# Patient Record
Sex: Male | Born: 1950 | Race: White | Hispanic: No | Smoking: Never smoker
Health system: Southern US, Community
[De-identification: ages and names within clinical notes are randomized; demographics above are authoritative.]

## PROBLEM LIST (undated history)

## (undated) DIAGNOSIS — M1990 Unspecified osteoarthritis, unspecified site: Secondary | ICD-10-CM

## (undated) DIAGNOSIS — M1710 Unilateral primary osteoarthritis, unspecified knee: Secondary | ICD-10-CM

## (undated) DIAGNOSIS — G4730 Sleep apnea, unspecified: Secondary | ICD-10-CM

## (undated) DIAGNOSIS — K429 Umbilical hernia without obstruction or gangrene: Secondary | ICD-10-CM

## (undated) DIAGNOSIS — K219 Gastro-esophageal reflux disease without esophagitis: Secondary | ICD-10-CM

## (undated) DIAGNOSIS — R7303 Prediabetes: Secondary | ICD-10-CM

## (undated) DIAGNOSIS — Z9109 Other allergy status, other than to drugs and biological substances: Secondary | ICD-10-CM

## (undated) DIAGNOSIS — M24112 Other articular cartilage disorders, left shoulder: Secondary | ICD-10-CM

## (undated) DIAGNOSIS — E7800 Pure hypercholesterolemia, unspecified: Secondary | ICD-10-CM

## (undated) HISTORY — PX: HERNIA REPAIR: SHX51

## (undated) HISTORY — PX: INCISION AND DRAINAGE ABSCESS ANAL: SUR669

## (undated) HISTORY — PX: JOINT REPLACEMENT: SHX530

---

## 2005-08-13 HISTORY — PX: FOOT SURGERY: SHX648

## 2006-08-13 HISTORY — PX: INGUINAL HERNIA REPAIR: SUR1180

## 2006-09-23 ENCOUNTER — Ambulatory Visit: Payer: Self-pay | Admitting: Specialist

## 2008-02-19 ENCOUNTER — Ambulatory Visit: Payer: Self-pay | Admitting: Unknown Physician Specialty

## 2009-01-06 ENCOUNTER — Ambulatory Visit: Payer: Self-pay | Admitting: Surgery

## 2009-08-13 HISTORY — PX: OTHER SURGICAL HISTORY: SHX169

## 2010-07-03 ENCOUNTER — Ambulatory Visit: Payer: Self-pay | Admitting: Gastroenterology

## 2010-08-13 HISTORY — PX: HERNIA REPAIR: SHX51

## 2011-01-29 ENCOUNTER — Observation Stay: Payer: Self-pay | Admitting: Surgery

## 2013-08-13 HISTORY — PX: ROTATOR CUFF REPAIR: SHX139

## 2013-12-16 ENCOUNTER — Ambulatory Visit: Payer: Self-pay | Admitting: General Practice

## 2014-01-08 ENCOUNTER — Ambulatory Visit: Payer: Self-pay | Admitting: General Practice

## 2014-12-04 NOTE — Op Note (Signed)
PATIENT NAME:  Richard Davies MR#:  [id number] DATE OF BIRTH:  04/19/1951  DATE OF PROCEDURE:  01/08/2014  PREOPERATIVE DIAGNOSIS:  Right rotator cuff tear.   POSTOPERATIVE DIAGNOSIS:  Right rotator cuff tear (supraspinatus, chronic).   PROCEDURE PERFORMED:  Right subacromial decompression and rotator cuff repair.   SURGEON:  Francesco SorJames Hooten, M.D.   ANESTHESIA:  Interscalene block and general.   ESTIMATED BLOOD LOSS:  Minimal.   DRAINS:  None.   IMPLANTS UTILIZED:  ArthroCare Spartan 5.5 mm PEEK suture anchors x 2.   INDICATIONS FOR SURGERY:  The patient is Davies 64 year old right-hand dominant male who has Davies history of progressive right shoulder pain.  He has not seen any significant improvement despite activity modification and conservative measures.  MRI demonstrated findings consistent with full-thickness tear of the supraspinatus tendon with retraction.  After discussion of the risks and benefits of surgical intervention, the patient expressed understanding of the risks and benefits and agreed with plans for surgical intervention.   PROCEDURE IN DETAIL:  The patient was brought into the Operating Room and, after adequate interscalene block and general anesthesia was achieved, the patient was placed in the modified beach chair position.  The head was secured in Davies headrest and all bony prominences were well padded.  The patient's right shoulder and arm were cleaned and prepped with alcohol and DuraPrep draped in the usual sterile fashion.  Davies "timeout" was performed as per usual protocol.  The anticipated incision site was injected with 0.25% Marcaine with epinephrine.  An anterior oblique incision was made roughly bisecting the anterior aspect of the acromion.  The deltoid fibers were split in line and Davies "deltoid on" approach was utilized by elevating the deltoid in Davies subperiosteal fashion off of the chromium.  Thickened subdeltoid bursa was excised.  Full-thickness tear of the supraspinatus was  noted with retraction of the entire supraspinatus.  Davies Darrach retractor was inserted and an anterior/inferior wafer of bone was removed from the acromion using an osteotome.  Thickness of the fragment was approximately 3 to 4 mm.  Some findings of nonunited os acromiale was noted and additional drain was performed.  Subacromial space was further contoured using Davies TPS high-speed rasp.  The wound was irrigated with copious amounts of normal saline with antibiotic solution and then suctioned dry.  The articular surface that was visualized superiorly was in good condition.  Davies Joker elevator was used to mobilize the remnant of the supraspinatus tendon.  Small bony trough was created using rongeurs for attachment of the tendon to the area of the greater tuberosity.  Two ArthroCare Spartan 5.5 mm PEEK suture anchors were inserted and the anterior portion of the supraspinatus tendon was repaired using the associated #2 nonabsorbable sutures.  This allowed for coverage of most of the humeral head.  Posterior portion was still left uncovered, but tissue was not viable for any additional repair of this area.  The shoulder was placed through Davies range of motion with good maintenance of the repair.  The wound was again irrigated with copious amounts of normal saline with antibiotic solution and then suctioned dry.  Good hemostasis was noted.  The deltoid was repaired in Davies side-to-side fashion using interrupted sutures of #1 Ethibond.  The subcutaneous tissue was approximated in layers using first #0 Vicryl followed by #2-0 Vicryl.  Skin was closed with skin staples.  Davies sterile dressing was applied.   The patient tolerated the procedure well.  He was transported to the recovery  room in stable condition.     ____________________________ Illene Labrador. Angie Fava., MD jph:ea D: 01/09/2014 14:26:43 ET T: 01/10/2014 00:20:45 ET JOB#: [id number]  cc: Illene Labrador. Angie Fava., MD, <Dictator> JAMES P Angie Fava MD ELECTRONICALLY SIGNED  01/10/2014 8:52

## 2016-02-22 ENCOUNTER — Other Ambulatory Visit: Payer: Self-pay | Admitting: Physician Assistant

## 2016-02-22 DIAGNOSIS — M7542 Impingement syndrome of left shoulder: Secondary | ICD-10-CM

## 2016-03-06 ENCOUNTER — Ambulatory Visit
Admission: RE | Admit: 2016-03-06 | Discharge: 2016-03-06 | Disposition: A | Discharge status: Home / Self Care | Payer: Commercial Managed Care - HMO | Source: Ambulatory Visit | Attending: Physician Assistant | Admitting: Physician Assistant

## 2016-03-06 DIAGNOSIS — M7582 Other shoulder lesions, left shoulder: Secondary | ICD-10-CM | POA: Insufficient documentation

## 2016-03-06 DIAGNOSIS — M24812 Other specific joint derangements of left shoulder, not elsewhere classified: Secondary | ICD-10-CM | POA: Diagnosis not present

## 2016-03-06 DIAGNOSIS — M7542 Impingement syndrome of left shoulder: Secondary | ICD-10-CM | POA: Insufficient documentation

## 2016-03-28 ENCOUNTER — Encounter
Admission: RE | Admit: 2016-03-28 | Discharge: 2016-03-28 | Disposition: A | Discharge status: Home / Self Care | Payer: 59 | Source: Ambulatory Visit | Attending: Surgery | Admitting: Surgery

## 2016-03-28 HISTORY — DX: Unspecified osteoarthritis, unspecified site: M19.90

## 2016-03-28 HISTORY — DX: Sleep apnea, unspecified: G47.30

## 2016-03-28 NOTE — Patient Instructions (Signed)
  Your procedure is scheduled on: 04-05-16 Report to Same Day Surgery 2nd floor medical mall To find out your arrival time please call [phone] between 1PM - 3PM on 04-04-16  Remember: Instructions that are not followed completely may result in serious medical risk, up to and including death, or upon the discretion of your surgeon and anesthesiologist your surgery may need to be rescheduled.    _x___ 1. Do not eat food or drink liquids after midnight. No gum chewing or hard candies.     __x__ 2. No Alcohol for 24 hours before or after surgery.   __x__3. No Smoking for 24 prior to surgery.   ____  4. Bring all medications with you on the day of surgery if instructed.    __x__ 5. Notify your doctor if there is any change in your medical condition     (cold, fever, infections).     Do not wear jewelry, make-up, hairpins, clips or nail polish.  Do not wear lotions, powders, or perfumes. You may wear deodorant.  Do not shave 48 hours prior to surgery. Men may shave face and neck.  Do not bring valuables to the hospital.    [facility] is not responsible for any belongings or valuables.               Contacts, dentures or bridgework may not be worn into surgery.  Leave your suitcase in the car. After surgery it may be brought to your room.  For patients admitted to the hospital, discharge time is determined by your treatment team.   Patients discharged the day of surgery will not be allowed to drive home.    Please read over the following fact sheets that you were given:   [facility] and or MRSA Information   ____ Take these medicines the morning of surgery with A SIP OF WATER:    1. NONE  2.  3.  4.  5.  6.  ____ Fleet Enema (as directed)   _x___ Use CHG Soap or sage wipes as directed on instruction sheet   ____ Use inhalers on the day of surgery and bring to hospital day of surgery  ____ Stop metformin 2 days prior to surgery    ____ Take 1/2 of  usual insulin dose the night before surgery and none on the morning of  surgery.   ____ Stop aspirin or coumadin, or plavix  _x__ Stop Anti-inflammatories such as Advil, Aleve, Ibuprofen, Motrin, Naproxen,          Naprosyn, Goodies powders or aspirin products-STOP NAPROXEN NOW. Ok to take Tylenol.   ____ Stop supplements until after surgery.    ____ Bring C-Pap to the hospital.

## 2016-04-05 ENCOUNTER — Ambulatory Visit: Payer: Commercial Managed Care - HMO | Admitting: Anesthesiology

## 2016-04-05 ENCOUNTER — Encounter
Admission: RE | Disposition: A | Discharge status: Home / Self Care | Payer: Self-pay | Source: Ambulatory Visit | Attending: Surgery

## 2016-04-05 ENCOUNTER — Ambulatory Visit
Admission: RE | Admit: 2016-04-05 | Discharge: 2016-04-05 | Disposition: A | Discharge status: Home / Self Care | Payer: Commercial Managed Care - HMO | Source: Ambulatory Visit | Attending: Surgery | Admitting: Surgery

## 2016-04-05 ENCOUNTER — Encounter: Payer: Self-pay | Admitting: *Deleted

## 2016-04-05 DIAGNOSIS — M75102 Unspecified rotator cuff tear or rupture of left shoulder, not specified as traumatic: Secondary | ICD-10-CM | POA: Diagnosis present

## 2016-04-05 DIAGNOSIS — Z8042 Family history of malignant neoplasm of prostate: Secondary | ICD-10-CM | POA: Insufficient documentation

## 2016-04-05 DIAGNOSIS — Z808 Family history of malignant neoplasm of other organs or systems: Secondary | ICD-10-CM | POA: Insufficient documentation

## 2016-04-05 DIAGNOSIS — Z9889 Other specified postprocedural states: Secondary | ICD-10-CM | POA: Insufficient documentation

## 2016-04-05 DIAGNOSIS — Z881 Allergy status to other antibiotic agents status: Secondary | ICD-10-CM | POA: Diagnosis not present

## 2016-04-05 DIAGNOSIS — Z87891 Personal history of nicotine dependence: Secondary | ICD-10-CM | POA: Diagnosis not present

## 2016-04-05 DIAGNOSIS — Z885 Allergy status to narcotic agent status: Secondary | ICD-10-CM | POA: Diagnosis not present

## 2016-04-05 DIAGNOSIS — Z880 Allergy status to penicillin: Secondary | ICD-10-CM | POA: Insufficient documentation

## 2016-04-05 DIAGNOSIS — G4730 Sleep apnea, unspecified: Secondary | ICD-10-CM | POA: Diagnosis not present

## 2016-04-05 DIAGNOSIS — Z791 Long term (current) use of non-steroidal anti-inflammatories (NSAID): Secondary | ICD-10-CM | POA: Insufficient documentation

## 2016-04-05 DIAGNOSIS — M65812 Other synovitis and tenosynovitis, left shoulder: Secondary | ICD-10-CM | POA: Diagnosis not present

## 2016-04-05 DIAGNOSIS — M1990 Unspecified osteoarthritis, unspecified site: Secondary | ICD-10-CM | POA: Insufficient documentation

## 2016-04-05 DIAGNOSIS — Z79899 Other long term (current) drug therapy: Secondary | ICD-10-CM | POA: Insufficient documentation

## 2016-04-05 DIAGNOSIS — M7542 Impingement syndrome of left shoulder: Secondary | ICD-10-CM | POA: Insufficient documentation

## 2016-04-05 DIAGNOSIS — M75112 Incomplete rotator cuff tear or rupture of left shoulder, not specified as traumatic: Secondary | ICD-10-CM | POA: Diagnosis not present

## 2016-04-05 HISTORY — PX: SHOULDER ARTHROSCOPY WITH SUBACROMIAL DECOMPRESSION: SHX5684

## 2016-04-05 HISTORY — PX: SHOULDER ARTHROSCOPY WITH OPEN ROTATOR CUFF REPAIR: SHX6092

## 2016-04-05 SURGERY — ARTHROSCOPY, SHOULDER WITH REPAIR, ROTATOR CUFF, OPEN
Anesthesia: General | Site: Shoulder | Laterality: Left

## 2016-04-05 MED ORDER — POTASSIUM CHLORIDE IN NACL 20-0.9 MEQ/L-% IV SOLN
INTRAVENOUS | Status: DC
  Filled 2016-04-05: qty 1000

## 2016-04-05 MED ORDER — ONDANSETRON HCL 4 MG PO TABS
4.0000 mg | ORAL_TABLET | Freq: Four times a day (QID) | ORAL | Status: DC | PRN
Start: 2016-04-05 — End: 2016-04-05

## 2016-04-05 MED ORDER — CLINDAMYCIN PHOSPHATE 900 MG/50ML IV SOLN
INTRAVENOUS | Status: AC
  Filled 2016-04-05: qty 50

## 2016-04-05 MED ORDER — EPHEDRINE SULFATE 50 MG/ML IJ SOLN
INTRAMUSCULAR | Status: DC | PRN
  Administered 2016-04-05: 15 mg via INTRAVENOUS

## 2016-04-05 MED ORDER — METOCLOPRAMIDE HCL 10 MG PO TABS: 5.0000 mg | ORAL_TABLET | Freq: Three times a day (TID) | ORAL | Status: DC | PRN

## 2016-04-05 MED ORDER — GLYCOPYRROLATE 0.2 MG/ML IJ SOLN
INTRAMUSCULAR | Status: DC | PRN
  Administered 2016-04-05: 0.4 mg via INTRAVENOUS
  Administered 2016-04-05: 0.2 mg via INTRAVENOUS

## 2016-04-05 MED ORDER — MIDAZOLAM HCL 2 MG/2ML IJ SOLN
INTRAMUSCULAR | Status: DC | PRN
  Administered 2016-04-05: 1 mg via INTRAVENOUS

## 2016-04-05 MED ORDER — PROPOFOL 10 MG/ML IV BOLUS
INTRAVENOUS | Status: DC | PRN
  Administered 2016-04-05: 140 mg via INTRAVENOUS

## 2016-04-05 MED ORDER — MIDAZOLAM HCL 5 MG/5ML IJ SOLN
INTRAMUSCULAR | Status: AC
  Administered 2016-04-05: 1 mg via INTRAVENOUS
  Filled 2016-04-05: qty 5

## 2016-04-05 MED ORDER — FENTANYL CITRATE (PF) 100 MCG/2ML IJ SOLN: 25.0000 ug | INTRAMUSCULAR | Status: DC | PRN

## 2016-04-05 MED ORDER — PROMETHAZINE HCL 25 MG/ML IJ SOLN: 6.2500 mg | INTRAMUSCULAR | Status: DC | PRN

## 2016-04-05 MED ORDER — ACETAMINOPHEN 10 MG/ML IV SOLN
INTRAVENOUS | Status: DC | PRN
  Administered 2016-04-05: 1000 mg via INTRAVENOUS

## 2016-04-05 MED ORDER — FENTANYL CITRATE (PF) 100 MCG/2ML IJ SOLN
INTRAMUSCULAR | Status: DC | PRN
  Administered 2016-04-05: 200 ug via INTRAVENOUS
  Administered 2016-04-05: 100 ug via INTRAVENOUS

## 2016-04-05 MED ORDER — CLINDAMYCIN PHOSPHATE 900 MG/50ML IV SOLN
900.0000 mg | Freq: Once | INTRAVENOUS | Status: AC
  Administered 2016-04-05: 900 mg via INTRAVENOUS

## 2016-04-05 MED ORDER — PHENYLEPHRINE HCL 10 MG/ML IJ SOLN
INTRAMUSCULAR | Status: DC | PRN
  Administered 2016-04-05: 100 ug via INTRAVENOUS
  Administered 2016-04-05: 200 ug via INTRAVENOUS
  Administered 2016-04-05 (×2): 100 ug via INTRAVENOUS

## 2016-04-05 MED ORDER — ONDANSETRON HCL 4 MG/2ML IJ SOLN
INTRAMUSCULAR | Status: DC | PRN
  Administered 2016-04-05: 4 mg via INTRAVENOUS

## 2016-04-05 MED ORDER — NEOSTIGMINE METHYLSULFATE 10 MG/10ML IV SOLN
INTRAVENOUS | Status: DC | PRN
  Administered 2016-04-05: 3 mg via INTRAVENOUS

## 2016-04-05 MED ORDER — FAMOTIDINE 20 MG PO TABS
20.0000 mg | ORAL_TABLET | Freq: Once | ORAL | Status: AC
  Administered 2016-04-05: 20 mg via ORAL

## 2016-04-05 MED ORDER — EPINEPHRINE HCL 1 MG/ML IJ SOLN
INTRAMUSCULAR | Status: DC | PRN
  Administered 2016-04-05: 2 mL

## 2016-04-05 MED ORDER — ONDANSETRON HCL 4 MG/2ML IJ SOLN: 4.0000 mg | Freq: Four times a day (QID) | INTRAMUSCULAR | Status: DC | PRN

## 2016-04-05 MED ORDER — OXYCODONE HCL 5 MG PO TABS: 5.0000 mg | ORAL_TABLET | Freq: Once | ORAL | Status: DC | PRN

## 2016-04-05 MED ORDER — ROPIVACAINE HCL 5 MG/ML IJ SOLN
INTRAMUSCULAR | Status: AC
  Filled 2016-04-05: qty 40

## 2016-04-05 MED ORDER — ROCURONIUM BROMIDE 100 MG/10ML IV SOLN
INTRAVENOUS | Status: DC | PRN
  Administered 2016-04-05: 50 mg via INTRAVENOUS

## 2016-04-05 MED ORDER — BUPIVACAINE-EPINEPHRINE (PF) 0.5% -1:200000 IJ SOLN
INTRAMUSCULAR | Status: AC
  Filled 2016-04-05: qty 30

## 2016-04-05 MED ORDER — LACTATED RINGERS IV SOLN
INTRAVENOUS | Status: DC
  Administered 2016-04-05 (×2): via INTRAVENOUS

## 2016-04-05 MED ORDER — EPINEPHRINE HCL 1 MG/ML IJ SOLN
INTRAMUSCULAR | Status: AC
  Filled 2016-04-05: qty 1

## 2016-04-05 MED ORDER — ROPIVACAINE HCL 5 MG/ML IJ SOLN
INTRAMUSCULAR | Status: DC | PRN
  Administered 2016-04-05: 30 mL via PERINEURAL

## 2016-04-05 MED ORDER — MIDAZOLAM HCL 5 MG/5ML IJ SOLN
1.0000 mg | Freq: Once | INTRAMUSCULAR | Status: AC
  Administered 2016-04-05: 1 mg via INTRAVENOUS

## 2016-04-05 MED ORDER — BUPIVACAINE-EPINEPHRINE 0.5% -1:200000 IJ SOLN
INTRAMUSCULAR | Status: DC | PRN
  Administered 2016-04-05: 30 mL

## 2016-04-05 MED ORDER — ACETAMINOPHEN 10 MG/ML IV SOLN
INTRAVENOUS | Status: AC
  Filled 2016-04-05: qty 100

## 2016-04-05 MED ORDER — FAMOTIDINE 20 MG PO TABS
ORAL_TABLET | ORAL | Status: AC
  Filled 2016-04-05: qty 1

## 2016-04-05 MED ORDER — LIDOCAINE HCL (PF) 1 % IJ SOLN
INTRAMUSCULAR | Status: AC
  Filled 2016-04-05: qty 5

## 2016-04-05 MED ORDER — OXYCODONE HCL 5 MG PO TABS: 5.0000 mg | ORAL_TABLET | ORAL | Status: DC | PRN

## 2016-04-05 MED ORDER — PHENYLEPHRINE HCL 10 MG/ML IJ SOLN
INTRAVENOUS | Status: DC | PRN
  Administered 2016-04-05: 30 ug/min via INTRAVENOUS

## 2016-04-05 MED ORDER — LIDOCAINE HCL (CARDIAC) 20 MG/ML IV SOLN
INTRAVENOUS | Status: DC | PRN
  Administered 2016-04-05: 60 mg via INTRAVENOUS

## 2016-04-05 MED ORDER — OXYCODONE HCL 5 MG PO TABS: 5.0000 mg | ORAL_TABLET | ORAL | 0 refills | Status: DC | PRN

## 2016-04-05 MED ORDER — METOCLOPRAMIDE HCL 5 MG/ML IJ SOLN: 5.0000 mg | Freq: Three times a day (TID) | INTRAMUSCULAR | Status: DC | PRN

## 2016-04-05 MED ORDER — MEPERIDINE HCL 25 MG/ML IJ SOLN: 6.2500 mg | INTRAMUSCULAR | Status: DC | PRN

## 2016-04-05 MED ORDER — LIDOCAINE HCL (PF) 4 % IJ SOLN
INTRAMUSCULAR | Status: DC | PRN
Start: 2016-04-05 — End: 2016-04-05
  Administered 2016-04-05: 4 mL via RESPIRATORY_TRACT

## 2016-04-05 MED ORDER — OXYCODONE HCL 5 MG/5ML PO SOLN: 5.0000 mg | Freq: Once | ORAL | Status: DC | PRN

## 2016-04-05 SURGICAL SUPPLY — 44 items
BIT DRILL JUGRKNT W/NDL BIT2.9 (DRILL) IMPLANT
BLADE FULL RADIUS 3.5 (BLADE) ×4 IMPLANT
BLADE SHAVER 4.5X7 STR FR (MISCELLANEOUS) IMPLANT
BUR ACROMIONIZER 4.0 (BURR) ×4 IMPLANT
BUR BR 5.5 WIDE MOUTH (BURR) IMPLANT
CANNULA SHAVER 8MMX76MM (CANNULA) ×4 IMPLANT
CHLORAPREP W/TINT 26ML (MISCELLANEOUS) ×8 IMPLANT
COVER MAYO STAND STRL (DRAPES) ×4 IMPLANT
DRAPE IMP U-DRAPE 54X76 (DRAPES) ×8 IMPLANT
DRILL JUGGERKNOT W/NDL BIT 2.9 (DRILL)
DRSG OPSITE POSTOP 4X8 (GAUZE/BANDAGES/DRESSINGS) ×4 IMPLANT
ELECT REM PT RETURN 9FT ADLT (ELECTROSURGICAL) ×4
ELECTRODE REM PT RTRN 9FT ADLT (ELECTROSURGICAL) ×2 IMPLANT
GAUZE PETRO XEROFOAM 1X8 (MISCELLANEOUS) ×4 IMPLANT
GAUZE SPONGE 4X4 12PLY STRL (GAUZE/BANDAGES/DRESSINGS) ×4 IMPLANT
GLOVE BIO SURGEON STRL SZ7.5 (GLOVE) ×8 IMPLANT
GLOVE BIO SURGEON STRL SZ8 (GLOVE) ×8 IMPLANT
GLOVE BIOGEL PI IND STRL 8 (GLOVE) ×2 IMPLANT
GLOVE BIOGEL PI INDICATOR 8 (GLOVE) ×2
GLOVE INDICATOR 8.0 STRL GRN (GLOVE) ×4 IMPLANT
GOWN STRL REUS W/ TWL LRG LVL3 (GOWN DISPOSABLE) ×4 IMPLANT
GOWN STRL REUS W/ TWL XL LVL3 (GOWN DISPOSABLE) ×2 IMPLANT
GOWN STRL REUS W/TWL LRG LVL3 (GOWN DISPOSABLE) ×4
GOWN STRL REUS W/TWL XL LVL3 (GOWN DISPOSABLE) ×2
GRASPER SUT 15 45D LOW PRO (SUTURE) IMPLANT
IV LACTATED RINGER IRRG 3000ML (IV SOLUTION) ×4
IV LR IRRIG 3000ML ARTHROMATIC (IV SOLUTION) ×4 IMPLANT
MANIFOLD NEPTUNE II (INSTRUMENTS) ×4 IMPLANT
MASK FACE SPIDER DISP (MASK) ×4 IMPLANT
MAT BLUE FLOOR 46X72 FLO (MISCELLANEOUS) ×4 IMPLANT
NEEDLE REVERSE CUT 1/2 CRC (NEEDLE) IMPLANT
PACK ARTHROSCOPY SHOULDER (MISCELLANEOUS) ×4 IMPLANT
SLING ARM LRG DEEP (SOFTGOODS) IMPLANT
SLING ULTRA II LG (MISCELLANEOUS) ×4 IMPLANT
STAPLER SKIN PROX 35W (STAPLE) ×4 IMPLANT
STRAP SAFETY BODY (MISCELLANEOUS) ×4 IMPLANT
SUT ETHIBOND 0 MO6 C/R (SUTURE) ×4 IMPLANT
SUT VIC AB 2-0 CT1 27 (SUTURE) ×4
SUT VIC AB 2-0 CT1 TAPERPNT 27 (SUTURE) ×4 IMPLANT
TAPE MICROFOAM 4IN (TAPE) ×4 IMPLANT
TUBING ARTHRO INFLOW-ONLY STRL (TUBING) ×4 IMPLANT
TUBING CONNECTING 10 (TUBING) ×3 IMPLANT
TUBING CONNECTING 10' (TUBING) ×1
WAND HAND CNTRL MULTIVAC 90 (MISCELLANEOUS) ×4 IMPLANT

## 2016-04-05 NOTE — Anesthesia Preprocedure Evaluation (Signed)
Anesthesia Evaluation  Patient identified by MRN, date of birth, ID band Patient awake    Reviewed: Allergy & Precautions, NPO status , Patient's Chart, lab work & pertinent test results  History of Anesthesia Complications Negative for: history of anesthetic complications  Airway Mallampati: II  TM Distance: >3 FB Neck ROM: Full    Dental no notable dental hx.    Pulmonary sleep apnea (does not use CPAP) , neg COPD,    breath sounds clear to auscultation- rhonchi (-) wheezing      Cardiovascular Exercise Tolerance: Good (-) hypertension(-) CAD and (-) Past MI  Rhythm:Regular Rate:Normal - Systolic murmurs and - Diastolic murmurs    Neuro/Psych negative neurological ROS  negative psych ROS   GI/Hepatic negative GI ROS, Neg liver ROS,   Endo/Other  negative endocrine ROSneg diabetes  Renal/GU negative Renal ROS     Musculoskeletal  (+) Arthritis , Osteoarthritis,    Abdominal (+) - obese,   Peds  Hematology negative hematology ROS (+)   Anesthesia Other Findings   Reproductive/Obstetrics                             Anesthesia Physical Anesthesia Plan  ASA: I  Anesthesia Plan: General   Post-op Pain Management:  Regional for Post-op pain   Induction: Intravenous  Airway Management Planned: Oral ETT  Additional Equipment:   Intra-op Plan:   Post-operative Plan: Extubation in OR  Informed Consent: I have reviewed the patients History and Physical, chart, labs and discussed the procedure including the risks, benefits and alternatives for the proposed anesthesia with the patient or authorized representative who has indicated his/her understanding and acceptance.   Dental advisory given  Plan Discussed with: CRNA and Anesthesiologist  Anesthesia Plan Comments:         Anesthesia Quick Evaluation

## 2016-04-05 NOTE — Anesthesia Postprocedure Evaluation (Signed)
Anesthesia Post Note  Patient: Glorious Peachhomas A Langworthy  Procedure(s) Performed: Procedure(s) (LRB): SHOULDER ARTHROSCOPY WITH OPEN ROTATOR CUFF REPAIR (Left) SHOULDER ARTHROSCOPY WITH SUBACROMIAL DECOMPRESSION AND LIMITED DEBRIDMENT (Left)  Patient location during evaluation: PACU Anesthesia Type: General Level of consciousness: awake and alert and oriented Pain management: pain level controlled Vital Signs Assessment: post-procedure vital signs reviewed and stable Respiratory status: spontaneous breathing, nonlabored ventilation and respiratory function stable Cardiovascular status: blood pressure returned to baseline and stable Postop Assessment: no signs of nausea or vomiting Anesthetic complications: no    Last Vitals:  Vitals:   04/05/16 1000 04/05/16 1207  BP: 134/87   Pulse: 61   Resp: 17   Temp:  (!) 36 C    Last Pain:  Vitals:   04/05/16 1207  TempSrc:   PainSc: 0-No pain                 Sholom Dulude

## 2016-04-05 NOTE — Transfer of Care (Signed)
Immediate Anesthesia Transfer of Care Note  Patient: Richard Davies  Procedure(s) Performed: Procedure(s): SHOULDER ARTHROSCOPY WITH OPEN ROTATOR CUFF REPAIR (Left) SHOULDER ARTHROSCOPY WITH SUBACROMIAL DECOMPRESSION AND LIMITED DEBRIDMENT (Left)  Patient Location: PACU  Anesthesia Type:General  Level of Consciousness: awake, alert , oriented and patient cooperative  Airway & Oxygen Therapy: Patient Spontanous Breathing and Patient connected to nasal cannula oxygen  Post-op Assessment: Report given to RN and Post -op Vital signs reviewed and stable  Post vital signs: Reviewed and stable  Last Vitals:  Vitals:   04/05/16 0950 04/05/16 1000  BP: 131/89 134/87  Pulse: (!) 57 61  Resp: 17 17  Temp:      Last Pain:  Vitals:   04/05/16 0805  TempSrc: Tympanic  PainSc: 5          Complications: No apparent anesthesia complications

## 2016-04-05 NOTE — Op Note (Signed)
04/05/2016  11:50 AM  Patient:   Richard Davies  Pre-Op Diagnosis:   Rotator cuff tear, left shoulder.  Postoperative diagnosis: Near full-thickness bursal surface rotator cuff tear and degenerative labral fraying, left shoulder.  Procedure: Limited arthroscopic debridement, arthroscopic subacromial decompression, and mini-open rotator cuff repair, left shoulder.  Anesthesia: General endotracheal with interscalene block placed preoperatively by the anesthesiologist.  Surgeon:   Maryagnes AmosJ. Jeffrey Xavier Fournier, MD  Assistant:   Horris LatinoLance McGhee, PA-C  Findings: As above. There was moderate fraying of the labrum anteriorly and superiorly without frank detachment from the glenoid. The biceps tendon itself was in excellent condition, as were the articular surfaces of the glenoid and humerus.  Complications: None  Fluids:   1100 cc  Estimated blood loss: 10 cc  Tourniquet time: None  Drains: None  Closure: Staples   Brief clinical note: The patient is a 65 year old male with a history of left shoulder pain. The patient's symptoms have progressed despite medications, activity modification, etc. The patient's history and examination are consistent with impingement/tendinopathy with a rotator cuff tear. These findings were confirmed by MRI scan. The patient presents at this time for definitive management of these shoulder symptoms.  Procedure: The patient underwent placement of an interscalene block by the anesthesiologist in the preoperative holding area before he was brought into the operating room and lain in the supine position. The patient then underwent general endotracheal intubation and anesthesia before being repositioned in the beach chair position using the beach chair positioner. The left shoulder and upper extremity were prepped with ChloraPrep solution before being draped sterilely. Preoperative antibiotics were administered. A timeout was performed to confirm the  proper surgical site before the expected portal sites and incision site were injected with 0.5% Sensorcaine with epinephrine. A posterior portal was created and the glenohumeral joint thoroughly inspected with the findings as described above. An anterior portal was created using an outside-in technique. The labrum and rotator cuff were further probed, again confirming the above-noted findings. Areas of labral fraying and synovitis anteriorly and superiorly were debrided using the full-radius resector. The ArthroCare wand was then used to obtain hemostasis as well as to "anneal" the labrum superiorly and anteriorly. The instruments were removed from the joint after suctioning the excess fluid.  The camera was repositioned through the posterior portal into the subacromial space. A separate lateral portal was created using an outside-in technique. The 3.5 mm full-radius resector was introduced and used to perform a subtotal bursectomy. The ArthroCare wand was then inserted and used to remove the periosteal tissue off the undersurface of the anterior third of the acromion as well as to recess the coracoacromial ligament from its attachment along the anterior and lateral margins of the acromion. The 4.0 mm acromionizing bur was introduced and used to complete the decompression by removing the undersurface of the anterior third of the acromion. The full radius resector was reintroduced to remove any residual bony debris before the ArthroCare wand was reintroduced to obtain hemostasis. The instruments were then removed from the subacromial space after suctioning the excess fluid.  An approximately 4-5 cm incision was made over the anterolateral aspect of the shoulder beginning at the anterolateral corner of the acromion and extending distally in line with the bicipital groove. This incision was carried down through the subcutaneous tissues to expose the deltoid fascia. The raphae between the anterior and middle thirds  was identified and this plane developed to provide access into the subacromial space. Additional bursal tissues were debrided  sharply using Metzenbaum scissors. The rotator cuff tear was readily identified. The margins were debrided sharply with a #15 blade. The configuration of the tear was noted to be primarily a longitudinal tear so it was elected to perform a side-to-side repair. Multiple #0 Ethibond interrupted sutures were placed in a side-to-side fashion to effect the repair. An apparent watertight closure was obtained.  The wound was copiously irrigated with sterile saline solution before the deltoid raphae was reapproximated using 2-0 Vicryl interrupted sutures. The subcutaneous tissues were closed in two layers using 2-0 Vicryl interrupted sutures before the skin was closed using staples. The portal sites also were closed using staples. A sterile bulky dressing was applied to the shoulder before the arm was placed into a shoulder immobilizer. The patient was then awakened, extubated, and returned to the recovery room in satisfactory condition after tolerating the procedure well.

## 2016-04-05 NOTE — Discharge Instructions (Addendum)
AMBULATORY SURGERY  DISCHARGE INSTRUCTIONS   1) The drugs that you were given will stay in your system until tomorrow so for the next 24 hours you should not:  A) Drive an automobile B) Make any legal decisions C) Drink any alcoholic beverage   2) You may resume regular meals tomorrow.  Today it is better to start with liquids and gradually work up to solid foods.  You may eat anything you prefer, but it is better to start with liquids, then soup and crackers, and gradually work up to solid foods.   3) Please notify your doctor immediately if you have any unusual bleeding, trouble breathing, redness and pain at the surgery site, drainage, fever, or pain not relieved by medication. 4)   5) Your post-operative visit with Dr.                                     is: Date:                        Time:    Please call to schedule your post-operative visit.     6) Additional Instructions: Keep dressing dry and intact.  May shower after dressing changed on post-op day #4 (Monday).  Cover staples/sutures with Band-Aids after drying off. Apply ice frequently to shoulder. Take Aleve 2 tabs twice daily with meals for 7-10 days, then as necessary. Take oxycodone as prescribed when needed.  May supplement with ES Tylenol if necessary. Keep shoulder immobilizer on at all times except may remove for bathing purposes. Follow-up in 10-14 days or as scheduled.

## 2016-04-05 NOTE — H&P (Signed)
Paper H&P to be scanned into permanent record. H&P reviewed. No changes. 

## 2016-04-05 NOTE — Anesthesia Procedure Notes (Addendum)
Anesthesia Regional Block:  Interscalene brachial plexus block  Pre-Anesthetic Checklist: ,, timeout performed, Correct Patient, Correct Site, Correct Laterality, Correct Procedure, Correct Position, site marked, Risks and benefits discussed,  Surgical consent,  Pre-op evaluation,  At surgeon's request and post-op pain management  Laterality: Left  Prep: alcohol swabs       Needles:  Injection technique: Single-shot  Needle Type: Stimiplex     Needle Length: 5cm 5 cm Needle Gauge: 21 and 21 G    Additional Needles:  Procedures: ultrasound guided (picture in chart) Interscalene brachial plexus block Narrative:  Start time: 04/05/2016 8:50 AM End time: 04/05/2016 9:02 AM Injection made incrementally with aspirations every 5 mL.  Performed by: Personally  Anesthesiologist: Nehemias Sauceda  Additional Notes: Functioning IV was confirmed and monitors were applied.  A 50mm 22ga Stimuplex needle was used. Sterile prep and drape,hand hygiene and sterile gloves were used.  Negative aspiration and negative test dose prior to incremental administration of local anesthetic. The patient tolerated the procedure well.

## 2016-04-05 NOTE — Anesthesia Procedure Notes (Signed)
Procedure Name: Intubation Date/Time: 04/05/2016 10:18 AM Performed by: Rosaria Ferries, Emmelyn Schmale Pre-anesthesia Checklist: Patient identified Patient Re-evaluated:Patient Re-evaluated prior to inductionOxygen Delivery Method: Circle system utilized Preoxygenation: Pre-oxygenation with 100% oxygen Intubation Type: IV induction Laryngoscope Size: Mac and 3 Grade View: Grade I Tube size: 7.0 mm Number of attempts: 1 Airway Equipment and Method: LTA kit utilized Placement Confirmation: ETT inserted through vocal cords under direct vision,  positive ETCO2 and breath sounds checked- equal and bilateral Secured at: 23 cm Tube secured with: Tape Dental Injury: Teeth and Oropharynx as per pre-operative assessment

## 2017-03-20 ENCOUNTER — Encounter
Admission: RE | Admit: 2017-03-20 | Discharge: 2017-03-20 | Disposition: A | Discharge status: Home / Self Care | Payer: 59 | Source: Ambulatory Visit | Attending: Orthopedic Surgery | Admitting: Orthopedic Surgery

## 2017-03-20 DIAGNOSIS — Z01812 Encounter for preprocedural laboratory examination: Secondary | ICD-10-CM | POA: Insufficient documentation

## 2017-03-20 LAB — CBC
HCT: 43.3 % (ref 40.0–52.0)
HEMOGLOBIN: 14.8 g/dL (ref 13.0–18.0)
MCH: 31.4 pg (ref 26.0–34.0)
MCHC: 34.1 g/dL (ref 32.0–36.0)
MCV: 92.1 fL (ref 80.0–100.0)
PLATELETS: 302 10*3/uL (ref 150–440)
RBC: 4.7 MIL/uL (ref 4.40–5.90)
RDW: 13.3 % (ref 11.5–14.5)
WBC: 9 10*3/uL (ref 3.8–10.6)

## 2017-03-20 LAB — URINALYSIS, ROUTINE W REFLEX MICROSCOPIC
Bilirubin Urine: NEGATIVE
GLUCOSE, UA: NEGATIVE mg/dL
HGB URINE DIPSTICK: NEGATIVE
KETONES UR: NEGATIVE mg/dL
LEUKOCYTES UA: NEGATIVE
Nitrite: NEGATIVE
PH: 5 (ref 5.0–8.0)
Protein, ur: NEGATIVE mg/dL
Specific Gravity, Urine: 1.017 (ref 1.005–1.030)

## 2017-03-20 LAB — COMPREHENSIVE METABOLIC PANEL
ALK PHOS: 47 U/L (ref 38–126)
ALT: 23 U/L (ref 17–63)
AST: 20 U/L (ref 15–41)
Albumin: 4 g/dL (ref 3.5–5.0)
Anion gap: 8 (ref 5–15)
BUN: 20 mg/dL (ref 6–20)
CALCIUM: 9.3 mg/dL (ref 8.9–10.3)
CO2: 24 mmol/L (ref 22–32)
CREATININE: 1.16 mg/dL (ref 0.61–1.24)
Chloride: 108 mmol/L (ref 101–111)
Glucose, Bld: 90 mg/dL (ref 65–99)
Potassium: 4 mmol/L (ref 3.5–5.1)
Sodium: 140 mmol/L (ref 135–145)
TOTAL PROTEIN: 6.6 g/dL (ref 6.5–8.1)
Total Bilirubin: 1.6 mg/dL — ABNORMAL HIGH (ref 0.3–1.2)

## 2017-03-20 LAB — SEDIMENTATION RATE: SED RATE: 3 mm/h (ref 0–20)

## 2017-03-20 LAB — TYPE AND SCREEN
ABO/RH(D): A POS
Antibody Screen: NEGATIVE

## 2017-03-20 LAB — C-REACTIVE PROTEIN

## 2017-03-20 LAB — SURGICAL PCR SCREEN
MRSA, PCR: NEGATIVE
Staphylococcus aureus: POSITIVE — AB

## 2017-03-20 LAB — APTT: aPTT: 32 seconds (ref 24–36)

## 2017-03-20 LAB — PROTIME-INR
INR: 0.88
PROTHROMBIN TIME: 11.9 s (ref 11.4–15.2)

## 2017-03-20 NOTE — Patient Instructions (Signed)
  Your procedure is scheduled WU:[id number]:Monday August 20 , 2018. Report to Same Day Surgery. To find out your arrival time please call [phone] between 1PM - 3PM on Friday March 29, 2017.  Remember: Instructions that are not followed completely may result in serious medical risk, up to and including death, or upon the discretion of your surgeon and anesthesiologist your surgery may need to be rescheduled.    _x___ 1. Do not eat food or drink liquids after midnight. No gum chewing or hard candies.     _x___ 2. No Alcohol for 24 hours before or after surgery.   ____ 3. Bring all medications with you on the day of surgery if instructed.    __x__ 4. Notify your doctor if there is any change in your medical condition     (cold, fever, infections).    _____ 5. No smoking 24 hours prior to surgery.     Do not wear jewelry, make-up, hairpins, clips or nail polish.  Do not wear lotions, powders, or perfumes.   Do not shave 48 hours prior to surgery. Men may shave face and neck.  Do not bring valuables to the hospital.    [facility] not [facility] for any belongings or valuables.               Contacts, dentures or bridgework may not be worn into surgery.  Leave your suitcase in the car. After surgery it may be brought to your room.  For patients admitted to the hospital, discharge time is determined by your treatment team.   Patients discharged the day of surgery will not be allowed to drive home.    Please read over the following fact sheets that you were given:   [facility]  ____ Take these medicines the morning of surgery with A SIP OF WATER: None     ____ Fleet Enema (as directed)   __x__ Use CHG Soap as directed on instruction sheet  ____ Use inhalers on the day of surgery and bring to hospital day of surgery  ____ Stop metformin 2 days prior to surgery    ____ Take 1/2 of usual insulin dose the night before surgery and none on the morning of  surgery.   ____ Stop Coumadin/Plavix/aspirin on does not apply.  ___x_ Stop Anti-inflammatories such as Advil, Aleve, Ibuprofen, Motrin, Naproxen, Naprosyn, Goodies powders or aspirin  products. OK to take Tylenol.   ____ Stop supplements until after surgery.    ____ Bring C-Pap to the hospital.

## 2017-03-20 NOTE — Pre-Procedure Instructions (Signed)
Progress Notes - in this encounter  Table of Contents for Progress Notes Hooten, Adelina MingsJames Philmon Jr., MD - 03/12/2017 8:45 AM EDT Nance Pewussell, Elaine W, RN - 03/12/2017 8:45 AM EDT   Ernest PineHooten, Adelina MingsJames Philmon Jr., MD - 03/12/2017 8:45 AM EDT Formatting of this note may be different from the original. Chief Complaint: Chief Complaint  Patient presents with  . Knee Pain  Bilateral knee degenerative arthrosis   Reason for Visit: The patient is a 66 y.o. male who presents today with his wife for reevaluation of his knees. He reports a greater than 10 year(s) history of bilateral knee pain. The right knee is somewhat more symptomatic than the left. He localizes most of the pain along the medial aspect of the knees. He reports some swelling, no locking, and some giving way of the knee. The pain is aggravated by going up and down stairs, kneeling, rising after sitting, squatting, standing and walking. The patient has not appreciated any significant improvement despite NSAIDs, intraarticular corticosteroid injections, and viscosupplementation. He is not using any ambulatory aids. The pain increases after he has been at work for 3-4 hours.  The patient states that the knee pain has progressed to the point that it is significantly interfering with his activities of daily living.  Medications: Current Outpatient Prescriptions  Medication Sig Dispense Refill  . naproxen sodium (ALEVE, ANAPROX) 220 MG tablet Take 220 mg by mouth as needed.  . peg-electrolyte (NULYTELY) solution Take 4,000 mLs by mouth as directed. 4000 mL 0  . sildenafil (REVATIO) 20 mg tablet 3-5 tablets po daily as needed   No current facility-administered medications for this visit.   Allergies: Allergies  Allergen Reactions  . Ciprofloxacin Other (See Comments)  Joint pain  . Codeine Nausea  . Penicillins Rash  childhood   Past Medical History: Past Medical History:  Diagnosis Date  . Environmental allergies  . History of  hernia repair   Past Surgical History: Past Surgical History:  Procedure Laterality Date  . COLONOSCOPY 07/03/2010  Dr. Eber HongM. Skulskie @ [facility] - Adenomatous Polyp, rpt 5 yrs per [facility], ltr mailed  . Foot surgery 2009  . HERNIA REPAIR [id number];2013  . INCISION & DRAINAGE ABSCESS RECTAL 2011  . Limited arthroscopic debridement, arthroscopic subacromial decompression and mini-open rotator cuff repair left shoulder Left 04/05/2016  Dr.Poggi  . REPAIR ROTATOR CUFF TEAR CHRONIC OPEN Right 01/08/14   Social History: Social History   Social History  . Marital status: Married  Spouse name: Herbert SetaHeather  . Number of children: 1  . Years of education: 3415   Occupational History  . [employer] [phone]  self   Social History Main Topics  . Smoking status: Never Smoker  . Smokeless tobacco: Former NeurosurgeonUser  Types: Chew  Quit date: 12/31/1993  . Alcohol use 4.2 oz/week  7 Cans of beer per week  . Drug use: No  . Sexual activity: Yes  Partners: Female   Other Topics Concern  . Not on file   Social History Narrative  . No narrative on file   Family History: Family History  Problem Relation Age of Onset  . Prostate cancer Father  . Brain cancer Brother  . Prostate cancer Brother   Review of Systems: A comprehensive 14 point ROS was performed, reviewed, and the pertinent orthopaedic findings are documented in the HPI.  Exam Ht 175.3 cm (5\' 9" )  Wt 84.1 kg (185 lb 6.4 oz)  BMI 27.38 kg/m   General:  Well-developed, well-nourished male seen  in no acute distress.  Antalgic gait. Varus thrust to both knees.  HEENT:  Atraumatic, normocephalic. Pupils are equal and reactive to light. Extraocular motion is intact. Sclera are clear. Oropharynx is clear with moist mucosa.  Neck:  Supple, nontender, and with good ROM. No thyromegaly, adenopathy, JVD, or carotid bruits.  Lungs:  Clear to auscultation bilaterally.  Cardiovascular:  Regular rate and rhythm. Normal S1, S2. No murmur .  No appreciable gallops or rubs. Peripheral pulses are palpable. No lower extremity edema. Homan`s test is negative.  Abdomen:  Soft, nontender, nondistended. Bowel sounds are present.  Extremities: Good strength, stability, and range of motion of the upper extremities. Good range of motion of the hips and ankles.  Right Knee: Soft tissue swelling: mild Effusion: minimal Erythema: none Crepitance: mild Tenderness: medial joint line Alignment: relative varus Mediolateral laxity: medial pseudolaxity Posterior sag: negative Patellar tracking: Good tracking without evidence of subluxation or tilt Atrophy: No significant atrophy.  Quadriceps tone was good. Range of motion: 0/0/128 degrees  Left Knee: Soft tissue swelling: mild Effusion: minimal Erythema: none Crepitance: mild Tenderness: medial joint line Alignment: relative varus Mediolateral laxity: medial pseudolaxity Posterior sag: negative Patellar tracking: Good tracking without evidence of subluxation or tilt Atrophy: No significant atrophy.  Quadriceps tone was good. Range of motion: 0/0/131 degrees  Neurologic:  Awake, alert, and oriented.  Sensory function is intact to pinprick and light touch.  Motor strength is judged to be 5/5.  Motor coordination is within normal limits.  No apparent clonus. No tremor.   X-rays: I ordered and interpreted standing AP, lateral, and sunrise radiographs of the left knee that were obtained in the office today. There is narrowing of the medial cartilage space with associated varus alignment. No significant osteophyte formation is appreciated. No evidence of fracture or dislocation.   I ordered and interpreted standing AP, lateral, and sunrise radiographs of the right knee that were obtained in the office today. There is narrowing of the medial cartilage space with associated varus alignment. Early osteophyte formation is noted. Subchondral sclerosis is noted. No evidence of fracture  or dislocation.   Impression: Degenerative arthrosis of both knees, right more symptomatic than left   Plan:  The findings were discussed in detail with the patient. The patient was given informational material on total knee replacement. Conservative treatment options were reviewed with the patient. We discussed the risks and benefits of surgical intervention. The usual perioperative course was also discussed in detail. The patient expressed understanding of the risks and benefits of surgical intervention and would like to proceed with plans for right total knee arthroplasty.  MEDICAL CLEARANCE: Per anesthesiology. ACTIVITY: As tolerated. WORK STATUS: Anticipate out of work for 6-8 weeks following surgery. THERAPY: Preoperative physical therapy evaluation. MEDICATIONS: New Prescriptions  No medications on file   FOLLOW-UP: Return for preop History & Physical pending surgery date.  James P. Angie Fava., M.D.

## 2017-03-20 NOTE — Pre-Procedure Instructions (Signed)
Pt did not receive the total joint class information in the mail with his other paperwork/appointments.  Pt will be attending the joint class on 03/27/2017.

## 2017-03-21 NOTE — Pre-Procedure Instructions (Signed)
LABS WITH POSITIVE STAPH FAXED TO DR Elenor LegatoHOOTEN,S

## 2017-03-22 LAB — URINE CULTURE
Culture: NO GROWTH
Special Requests: NORMAL

## 2017-03-31 MED ORDER — TRANEXAMIC ACID 1000 MG/10ML IV SOLN
1000.0000 mg | INTRAVENOUS | Status: DC
  Filled 2017-03-31: qty 10

## 2017-03-31 MED ORDER — CLINDAMYCIN PHOSPHATE 900 MG/50ML IV SOLN: 900.0000 mg | INTRAVENOUS | Status: DC

## 2017-04-01 ENCOUNTER — Inpatient Hospital Stay
Admission: RE | Admit: 2017-04-01 | Discharge: 2017-04-03 | DRG: 470 | Disposition: A | Discharge status: Home / Self Care | Payer: Commercial Managed Care - HMO | Source: Ambulatory Visit | Attending: Orthopedic Surgery | Admitting: Orthopedic Surgery

## 2017-04-01 ENCOUNTER — Encounter: Payer: Self-pay | Admitting: Orthopedic Surgery

## 2017-04-01 ENCOUNTER — Inpatient Hospital Stay: Payer: Commercial Managed Care - HMO | Admitting: Anesthesiology

## 2017-04-01 ENCOUNTER — Inpatient Hospital Stay: Payer: Commercial Managed Care - HMO

## 2017-04-01 ENCOUNTER — Encounter
Admission: RE | Disposition: A | Discharge status: Home / Self Care | Payer: Self-pay | Source: Ambulatory Visit | Attending: Orthopedic Surgery

## 2017-04-01 DIAGNOSIS — G4730 Sleep apnea, unspecified: Secondary | ICD-10-CM | POA: Diagnosis present

## 2017-04-01 DIAGNOSIS — M25761 Osteophyte, right knee: Secondary | ICD-10-CM | POA: Diagnosis present

## 2017-04-01 DIAGNOSIS — Z96651 Presence of right artificial knee joint: Secondary | ICD-10-CM

## 2017-04-01 DIAGNOSIS — M1711 Unilateral primary osteoarthritis, right knee: Principal | ICD-10-CM | POA: Diagnosis present

## 2017-04-01 DIAGNOSIS — Z96659 Presence of unspecified artificial knee joint: Secondary | ICD-10-CM

## 2017-04-01 HISTORY — PX: KNEE ARTHROPLASTY: SHX992

## 2017-04-01 LAB — ABO/RH: ABO/RH(D): A POS

## 2017-04-01 SURGERY — ARTHROPLASTY, KNEE, TOTAL, USING IMAGELESS COMPUTER-ASSISTED NAVIGATION
Anesthesia: Spinal | Site: Knee | Laterality: Right | Wound class: Clean

## 2017-04-01 MED ORDER — BUPIVACAINE HCL (PF) 0.5 % IJ SOLN
INTRAMUSCULAR | Status: DC | PRN
  Administered 2017-04-01: 3 mL

## 2017-04-01 MED ORDER — SODIUM CHLORIDE 0.9 % IV SOLN
INTRAVENOUS | Status: DC
  Administered 2017-04-01 – 2017-04-02 (×2): via INTRAVENOUS

## 2017-04-01 MED ORDER — SODIUM CHLORIDE 0.9 % IV SOLN
INTRAVENOUS | Status: DC | PRN
  Administered 2017-04-01: 20 ug/min via INTRAVENOUS

## 2017-04-01 MED ORDER — ONDANSETRON HCL 4 MG/2ML IJ SOLN: 4.0000 mg | Freq: Four times a day (QID) | INTRAMUSCULAR | Status: DC | PRN

## 2017-04-01 MED ORDER — BUPIVACAINE LIPOSOME 1.3 % IJ SUSP
INTRAMUSCULAR | Status: DC | PRN
  Administered 2017-04-01: 60 mL

## 2017-04-01 MED ORDER — ACETAMINOPHEN 10 MG/ML IV SOLN
1000.0000 mg | Freq: Four times a day (QID) | INTRAVENOUS | Status: AC
  Administered 2017-04-01 – 2017-04-02 (×4): 1000 mg via INTRAVENOUS
  Filled 2017-04-01 (×4): qty 100

## 2017-04-01 MED ORDER — MIDAZOLAM HCL 2 MG/2ML IJ SOLN
INTRAMUSCULAR | Status: AC
  Filled 2017-04-01: qty 2

## 2017-04-01 MED ORDER — ONDANSETRON HCL 4 MG PO TABS: 4.0000 mg | ORAL_TABLET | Freq: Four times a day (QID) | ORAL | Status: DC | PRN

## 2017-04-01 MED ORDER — NEOMYCIN-POLYMYXIN B GU 40-200000 IR SOLN
Status: DC | PRN
  Administered 2017-04-01: 14 mL

## 2017-04-01 MED ORDER — TRAMADOL HCL 50 MG PO TABS
50.0000 mg | ORAL_TABLET | ORAL | Status: DC | PRN
  Administered 2017-04-01 – 2017-04-02 (×3): 100 mg via ORAL
  Filled 2017-04-01 (×3): qty 2

## 2017-04-01 MED ORDER — ONDANSETRON HCL 4 MG/2ML IJ SOLN: 4.0000 mg | Freq: Once | INTRAMUSCULAR | Status: DC | PRN

## 2017-04-01 MED ORDER — TRANEXAMIC ACID 1000 MG/10ML IV SOLN
1000.0000 mg | Freq: Once | INTRAVENOUS | Status: AC
  Administered 2017-04-01: 1000 mg via INTRAVENOUS
  Filled 2017-04-01: qty 10

## 2017-04-01 MED ORDER — CLINDAMYCIN PHOSPHATE 900 MG/50ML IV SOLN
INTRAVENOUS | Status: DC | PRN
  Administered 2017-04-01: 900 mg via INTRAVENOUS

## 2017-04-01 MED ORDER — PHENOL 1.4 % MT LIQD
1.0000 | OROMUCOSAL | Status: DC | PRN
  Filled 2017-04-01: qty 177

## 2017-04-01 MED ORDER — SENNOSIDES-DOCUSATE SODIUM 8.6-50 MG PO TABS
1.0000 | ORAL_TABLET | Freq: Two times a day (BID) | ORAL | Status: DC
  Administered 2017-04-01 – 2017-04-02 (×3): 1 via ORAL
  Filled 2017-04-01 (×4): qty 1

## 2017-04-01 MED ORDER — ACETAMINOPHEN 325 MG PO TABS: 650.0000 mg | ORAL_TABLET | Freq: Four times a day (QID) | ORAL | Status: DC | PRN

## 2017-04-01 MED ORDER — BISACODYL 10 MG RE SUPP: 10.0000 mg | Freq: Every day | RECTAL | Status: DC | PRN

## 2017-04-01 MED ORDER — PROPOFOL 500 MG/50ML IV EMUL
INTRAVENOUS | Status: DC | PRN
  Administered 2017-04-01: 60 ug/kg/min via INTRAVENOUS
  Administered 2017-04-01: 70 ug/kg/min via INTRAVENOUS

## 2017-04-01 MED ORDER — ENOXAPARIN SODIUM 30 MG/0.3ML ~~LOC~~ SOLN
30.0000 mg | Freq: Two times a day (BID) | SUBCUTANEOUS | Status: DC
  Administered 2017-04-02 – 2017-04-03 (×3): 30 mg via SUBCUTANEOUS
  Filled 2017-04-01 (×3): qty 0.3

## 2017-04-01 MED ORDER — SODIUM CHLORIDE 0.9 % IV SOLN
INTRAVENOUS | Status: DC | PRN
  Administered 2017-04-01: 12:00:00 via INTRAVENOUS
  Administered 2017-04-01: 1000 mg via INTRAVENOUS

## 2017-04-01 MED ORDER — FERROUS SULFATE 325 (65 FE) MG PO TABS
325.0000 mg | ORAL_TABLET | Freq: Two times a day (BID) | ORAL | Status: DC
  Administered 2017-04-01 – 2017-04-03 (×4): 325 mg via ORAL
  Filled 2017-04-01 (×4): qty 1

## 2017-04-01 MED ORDER — FENTANYL CITRATE (PF) 100 MCG/2ML IJ SOLN
INTRAMUSCULAR | Status: DC | PRN
  Administered 2017-04-01: 100 ug via INTRAVENOUS

## 2017-04-01 MED ORDER — MENTHOL 3 MG MT LOZG
1.0000 | LOZENGE | OROMUCOSAL | Status: DC | PRN
  Filled 2017-04-01: qty 9

## 2017-04-01 MED ORDER — ALUM & MAG HYDROXIDE-SIMETH 200-200-20 MG/5ML PO SUSP: 30.0000 mL | ORAL | Status: DC | PRN

## 2017-04-01 MED ORDER — PROPOFOL 500 MG/50ML IV EMUL
INTRAVENOUS | Status: AC
  Filled 2017-04-01: qty 50

## 2017-04-01 MED ORDER — LIDOCAINE HCL (PF) 2 % IJ SOLN
INTRAMUSCULAR | Status: AC
  Filled 2017-04-01: qty 2

## 2017-04-01 MED ORDER — PANTOPRAZOLE SODIUM 40 MG PO TBEC
40.0000 mg | DELAYED_RELEASE_TABLET | Freq: Two times a day (BID) | ORAL | Status: DC
  Administered 2017-04-01 – 2017-04-03 (×4): 40 mg via ORAL
  Filled 2017-04-01 (×4): qty 1

## 2017-04-01 MED ORDER — FLEET ENEMA 7-19 GM/118ML RE ENEM: 1.0000 | ENEMA | Freq: Once | RECTAL | Status: DC | PRN

## 2017-04-01 MED ORDER — BUPIVACAINE HCL (PF) 0.25 % IJ SOLN
INTRAMUSCULAR | Status: DC | PRN
  Administered 2017-04-01: 60 mL

## 2017-04-01 MED ORDER — LACTATED RINGERS IV SOLN
INTRAVENOUS | Status: DC
  Administered 2017-04-01 (×2): via INTRAVENOUS

## 2017-04-01 MED ORDER — CHLORHEXIDINE GLUCONATE 4 % EX LIQD: 60.0000 mL | Freq: Once | CUTANEOUS | Status: DC

## 2017-04-01 MED ORDER — GLYCOPYRROLATE 0.2 MG/ML IJ SOLN
INTRAMUSCULAR | Status: DC | PRN
  Administered 2017-04-01: 0.2 mg via INTRAVENOUS

## 2017-04-01 MED ORDER — DIPHENHYDRAMINE HCL 12.5 MG/5ML PO ELIX: 12.5000 mg | ORAL_SOLUTION | ORAL | Status: DC | PRN

## 2017-04-01 MED ORDER — MORPHINE SULFATE (PF) 2 MG/ML IV SOLN
2.0000 mg | INTRAVENOUS | Status: DC | PRN
Start: 2017-04-01 — End: 2017-04-03

## 2017-04-01 MED ORDER — ACETAMINOPHEN 10 MG/ML IV SOLN
INTRAVENOUS | Status: DC | PRN
  Administered 2017-04-01: 1000 mg via INTRAVENOUS

## 2017-04-01 MED ORDER — PROPOFOL 10 MG/ML IV BOLUS
INTRAVENOUS | Status: DC | PRN
  Administered 2017-04-01 (×2): 17 mg via INTRAVENOUS

## 2017-04-01 MED ORDER — OXYCODONE HCL 5 MG PO TABS
5.0000 mg | ORAL_TABLET | ORAL | Status: DC | PRN
  Administered 2017-04-01: 5 mg via ORAL
  Administered 2017-04-01 – 2017-04-02 (×4): 10 mg via ORAL
  Filled 2017-04-01 (×4): qty 2
  Filled 2017-04-01: qty 1

## 2017-04-01 MED ORDER — MIDAZOLAM HCL 5 MG/5ML IJ SOLN
INTRAMUSCULAR | Status: DC | PRN
  Administered 2017-04-01: 1 mg via INTRAVENOUS

## 2017-04-01 MED ORDER — ACETAMINOPHEN 10 MG/ML IV SOLN
INTRAVENOUS | Status: AC
Start: 2017-04-01 — End: 2017-04-01
  Filled 2017-04-01: qty 100

## 2017-04-01 MED ORDER — MAGNESIUM HYDROXIDE 400 MG/5ML PO SUSP
30.0000 mL | Freq: Every day | ORAL | Status: DC | PRN
  Administered 2017-04-02: 30 mL via ORAL
  Filled 2017-04-01: qty 30

## 2017-04-01 MED ORDER — FAMOTIDINE 20 MG PO TABS
20.0000 mg | ORAL_TABLET | Freq: Once | ORAL | Status: AC
  Administered 2017-04-01: 20 mg via ORAL

## 2017-04-01 MED ORDER — DEXAMETHASONE SODIUM PHOSPHATE 4 MG/ML IJ SOLN
INTRAMUSCULAR | Status: DC | PRN
  Administered 2017-04-01: 5 mg via INTRAVENOUS

## 2017-04-01 MED ORDER — CELECOXIB 200 MG PO CAPS
200.0000 mg | ORAL_CAPSULE | Freq: Two times a day (BID) | ORAL | Status: DC
  Administered 2017-04-01 – 2017-04-03 (×4): 200 mg via ORAL
  Filled 2017-04-01 (×4): qty 1

## 2017-04-01 MED ORDER — ACETAMINOPHEN 650 MG RE SUPP: 650.0000 mg | Freq: Four times a day (QID) | RECTAL | Status: DC | PRN

## 2017-04-01 MED ORDER — FENTANYL CITRATE (PF) 100 MCG/2ML IJ SOLN: 25.0000 ug | INTRAMUSCULAR | Status: DC | PRN

## 2017-04-01 MED ORDER — CLINDAMYCIN PHOSPHATE 600 MG/50ML IV SOLN
600.0000 mg | Freq: Four times a day (QID) | INTRAVENOUS | Status: AC
  Administered 2017-04-01 – 2017-04-02 (×4): 600 mg via INTRAVENOUS
  Filled 2017-04-01 (×4): qty 50

## 2017-04-01 MED ORDER — FENTANYL CITRATE (PF) 100 MCG/2ML IJ SOLN
INTRAMUSCULAR | Status: AC
  Filled 2017-04-01: qty 2

## 2017-04-01 MED ORDER — METOCLOPRAMIDE HCL 10 MG PO TABS
10.0000 mg | ORAL_TABLET | Freq: Three times a day (TID) | ORAL | Status: DC
Start: 2017-04-01 — End: 2017-04-03
  Administered 2017-04-01 – 2017-04-03 (×7): 10 mg via ORAL
  Filled 2017-04-01 (×7): qty 1

## 2017-04-01 SURGICAL SUPPLY — 64 items
BATTERY INSTRU NAVIGATION (MISCELLANEOUS) ×12 IMPLANT
BLADE SAW 1 (BLADE) ×3 IMPLANT
BLADE SAW 1/2 (BLADE) ×3 IMPLANT
BLADE SAW 70X12.5 (BLADE) IMPLANT
CANISTER SUCT 1200ML W/VALVE (MISCELLANEOUS) ×3 IMPLANT
CANISTER SUCT 3000ML PPV (MISCELLANEOUS) ×6 IMPLANT
CAPT KNEE TOTAL 3 ATTUNE ×3 IMPLANT
CATH TRAY METER 16FR LF (MISCELLANEOUS) ×3 IMPLANT
CEMENT HV SMART SET (Cement) ×6 IMPLANT
COOLER POLAR GLACIER W/PUMP (MISCELLANEOUS) ×3 IMPLANT
CUFF TOURN 24 STER (MISCELLANEOUS) IMPLANT
CUFF TOURN 30 STER DUAL PORT (MISCELLANEOUS) ×3 IMPLANT
DRAPE SHEET LG 3/4 BI-LAMINATE (DRAPES) ×3 IMPLANT
DRSG DERMACEA 8X12 NADH (GAUZE/BANDAGES/DRESSINGS) ×3 IMPLANT
DRSG OPSITE POSTOP 4X14 (GAUZE/BANDAGES/DRESSINGS) ×3 IMPLANT
DRSG TEGADERM 4X4.75 (GAUZE/BANDAGES/DRESSINGS) ×3 IMPLANT
DURAPREP 26ML APPLICATOR (WOUND CARE) ×6 IMPLANT
ELECT CAUTERY BLADE 6.4 (BLADE) ×3 IMPLANT
ELECT REM PT RETURN 9FT ADLT (ELECTROSURGICAL) ×3
ELECTRODE REM PT RTRN 9FT ADLT (ELECTROSURGICAL) ×1 IMPLANT
EVACUATOR 1/8 PVC DRAIN (DRAIN) ×3 IMPLANT
EX-PIN ORTHOLOCK NAV 4X150 (PIN) ×6 IMPLANT
GLOVE BIO SURGEON STRL SZ7 (GLOVE) ×9 IMPLANT
GLOVE BIOGEL M STRL SZ7.5 (GLOVE) ×6 IMPLANT
GLOVE BIOGEL PI IND STRL 9 (GLOVE) ×2 IMPLANT
GLOVE BIOGEL PI INDICATOR 9 (GLOVE) ×4
GLOVE INDICATOR 7.5 STRL GRN (GLOVE) ×9 IMPLANT
GLOVE INDICATOR 8.0 STRL GRN (GLOVE) ×6 IMPLANT
GLOVE SURG SYN 9.0  PF PI (GLOVE) ×2
GLOVE SURG SYN 9.0 PF PI (GLOVE) ×1 IMPLANT
GOWN STRL REUS W/ TWL LRG LVL3 (GOWN DISPOSABLE) ×2 IMPLANT
GOWN STRL REUS W/TWL 2XL LVL3 (GOWN DISPOSABLE) ×3 IMPLANT
GOWN STRL REUS W/TWL LRG LVL3 (GOWN DISPOSABLE) ×4
HOLDER FOLEY CATH W/STRAP (MISCELLANEOUS) ×3 IMPLANT
HOOD PEEL AWAY FLYTE STAYCOOL (MISCELLANEOUS) ×6 IMPLANT
KIT RM TURNOVER STRD PROC AR (KITS) ×3 IMPLANT
KNIFE SCULPS 14X20 (INSTRUMENTS) ×3 IMPLANT
LABEL OR SOLS (LABEL) ×3 IMPLANT
NDL SAFETY 18GX1.5 (NEEDLE) ×3 IMPLANT
NEEDLE SPNL 20GX3.5 QUINCKE YW (NEEDLE) ×3 IMPLANT
NS IRRIG 500ML POUR BTL (IV SOLUTION) ×3 IMPLANT
PACK TOTAL KNEE (MISCELLANEOUS) ×3 IMPLANT
PAD WRAPON POLAR KNEE (MISCELLANEOUS) ×1 IMPLANT
PIN DRILL QUICK PACK ×3 IMPLANT
PIN FIXATION 1/8DIA X 3INL (PIN) ×3 IMPLANT
PULSAVAC PLUS IRRIG FAN TIP (DISPOSABLE) ×3
SOL .9 NS 3000ML IRR  AL (IV SOLUTION) ×2
SOL .9 NS 3000ML IRR UROMATIC (IV SOLUTION) ×1 IMPLANT
SOL PREP PVP 2OZ (MISCELLANEOUS) ×3
SOLUTION PREP PVP 2OZ (MISCELLANEOUS) ×1 IMPLANT
SPONGE DRAIN TRACH 4X4 STRL 2S (GAUZE/BANDAGES/DRESSINGS) ×3 IMPLANT
STAPLER SKIN PROX 35W (STAPLE) ×3 IMPLANT
STRAP TIBIA SHORT (MISCELLANEOUS) ×3 IMPLANT
SUCTION FRAZIER HANDLE 10FR (MISCELLANEOUS) ×2
SUCTION TUBE FRAZIER 10FR DISP (MISCELLANEOUS) ×1 IMPLANT
SUT VIC AB 0 CT1 36 (SUTURE) ×3 IMPLANT
SUT VIC AB 1 CT1 36 (SUTURE) ×6 IMPLANT
SUT VIC AB 2-0 CT2 27 (SUTURE) ×3 IMPLANT
SYR 20CC LL (SYRINGE) ×3 IMPLANT
SYR 30ML LL (SYRINGE) ×6 IMPLANT
TIP FAN IRRIG PULSAVAC PLUS (DISPOSABLE) ×1 IMPLANT
TOWEL OR 17X26 4PK STRL BLUE (TOWEL DISPOSABLE) ×3 IMPLANT
TOWER CARTRIDGE SMART MIX (DISPOSABLE) ×3 IMPLANT
WRAPON POLAR PAD KNEE (MISCELLANEOUS) ×3

## 2017-04-01 NOTE — Anesthesia Procedure Notes (Signed)
Spinal  Patient location during procedure: OR Start time: 04/01/2017 11:01 AM End time: 04/01/2017 11:06 AM Staffing Performed: resident/CRNA  Preanesthetic Checklist Completed: patient identified, site marked, surgical consent, pre-op evaluation, timeout performed, IV checked, risks and benefits discussed and monitors and equipment checked Spinal Block Patient position: sitting Prep: Betadine Patient monitoring: heart rate, continuous pulse ox, blood pressure and cardiac monitor Approach: midline Location: L4-5 Injection technique: single-shot Needle Needle type: Introducer and Pencan  Needle gauge: 24 G Needle length: 9 cm Additional Notes Negative paresthesia. Negative blood return. Positive free-flowing CSF. Expiration date of kit checked and confirmed. Patient tolerated procedure well, without complications.

## 2017-04-01 NOTE — Transfer of Care (Signed)
Immediate Anesthesia Transfer of Care Note  Patient: GRIFFEY POLONIA  Procedure(s) Performed: Procedure(s): COMPUTER ASSISTED TOTAL KNEE ARTHROPLASTY (Right)  Patient Location: PACU  Anesthesia Type:Spinal  Level of Consciousness: awake, alert , oriented and patient cooperative  Airway & Oxygen Therapy: Patient Spontanous Breathing and Patient connected to nasal cannula oxygen  Post-op Assessment: Report given to RN and Post -op Vital signs reviewed and stable  Post vital signs: Reviewed and stable  Last Vitals:  Vitals:   04/01/17 1012  BP: (!) 147/83  Pulse: 60  Resp: 16  Temp: 36.8 C  SpO2: 98%    Last Pain:  Vitals:   04/01/17 1012  TempSrc: Oral         Complications: No apparent anesthesia complications

## 2017-04-01 NOTE — NC FL2 (Signed)
[place] MEDICAID FL2 LEVEL OF CARE SCREENING TOOL     IDENTIFICATION  Patient Name: Richard Davies Birthdate: 05-Apr-1951 Sex: male Admission Date (Current Location): 04/01/2017  [place] and [place] Number:  Chiropodist and Address:  [facility], [address] [place], [place] [address]      Provider Number: [id number]  Attending Physician Name and Address:  Donato Heinz, MD  Relative Name and Phone Number:       Current Level of Care: Hospital Recommended Level of Care: Skilled Nursing Facility Prior Approval Number:    Date Approved/Denied:   PASRR Number:  [id number])  Discharge Plan: SNF    Current Diagnoses: Patient Active Problem List   Diagnosis Date Noted  . S/P total knee arthroplasty 04/01/2017    Orientation RESPIRATION BLADDER Height & Weight     Self, Time, Situation, Place  Normal Continent Weight: 185 lb (83.9 kg) Height:  5\' 9"  (175.3 cm)  BEHAVIORAL SYMPTOMS/MOOD NEUROLOGICAL BOWEL NUTRITION STATUS   (none)  (none) Continent Diet (Regular Diet )  AMBULATORY STATUS COMMUNICATION OF NEEDS Skin   Extensive Assist Verbally Surgical wounds (Incision: Right Knee )                       Personal Care Assistance Level of Assistance  Bathing, Feeding, Dressing Bathing Assistance: Limited assistance Feeding assistance: Independent Dressing Assistance: Limited assistance     Functional Limitations Info  Sight, Hearing, Speech Sight Info: Adequate Hearing Info: Adequate Speech Info: Adequate    SPECIAL CARE FACTORS FREQUENCY  PT (By licensed PT), OT (By licensed OT)     PT Frequency:  (5) OT Frequency:  (5)            Contractures      Additional Factors Info  Code Status, Allergies Code Status Info:  (Full Code. ) Allergies Info:  (Codeine, Ciprofloxacin, Penicillins)           Current Medications (04/01/2017):  This is the current hospital active medication list Current  Facility-Administered Medications  Medication Dose Route Frequency Provider Last Rate Last Dose  . 0.9 %  sodium chloride infusion   Intravenous Continuous Hooten, Illene Labrador, MD 100 mL/hr at 04/01/17 1615    . acetaminophen (OFIRMEV) IV 1,000 mg  1,000 mg Intravenous Q6H Hooten, Illene Labrador, MD      . acetaminophen (TYLENOL) tablet 650 mg  650 mg Oral Q6H PRN Hooten, Illene Labrador, MD       Or  . acetaminophen (TYLENOL) suppository 650 mg  650 mg Rectal Q6H PRN Hooten, Illene Labrador, MD      . alum & mag hydroxide-simeth (MAALOX/MYLANTA) 200-200-20 MG/5ML suspension 30 mL  30 mL Oral Q4H PRN Hooten, Illene Labrador, MD      . bisacodyl (DULCOLAX) suppository 10 mg  10 mg Rectal Daily PRN Hooten, Illene Labrador, MD      . celecoxib (CELEBREX) capsule 200 mg  200 mg Oral Q12H Hooten, Illene Labrador, MD      . clindamycin (CLEOCIN) IVPB 600 mg  600 mg Intravenous Q6H Hooten, Illene Labrador, MD      . diphenhydrAMINE (BENADRYL) 12.5 MG/5ML elixir 12.5-25 mg  12.5-25 mg Oral Q4H PRN Hooten, Illene Labrador, MD      . Melene Muller ON 04/02/2017] enoxaparin (LOVENOX) injection 30 mg  30 mg Subcutaneous Q12H Hooten, Illene Labrador, MD      . ferrous sulfate tablet 325 mg  325 mg Oral BID WC Hooten,  Illene Labrador, MD      . magnesium hydroxide (MILK OF MAGNESIA) suspension 30 mL  30 mL Oral Daily PRN Hooten, Illene Labrador, MD      . menthol-cetylpyridinium (CEPACOL) lozenge 3 mg  1 lozenge Oral PRN Hooten, Illene Labrador, MD       Or  . phenol (CHLORASEPTIC) mouth spray 1 spray  1 spray Mouth/Throat PRN Hooten, Illene Labrador, MD      . metoCLOPramide (REGLAN) tablet 10 mg  10 mg Oral TID AC & HS Hooten, Illene Labrador, MD      . morphine 2 MG/ML injection 2 mg  2 mg Intravenous Q2H PRN Hooten, Illene Labrador, MD      . ondansetron (ZOFRAN) tablet 4 mg  4 mg Oral Q6H PRN Hooten, Illene Labrador, MD       Or  . ondansetron (ZOFRAN) injection 4 mg  4 mg Intravenous Q6H PRN Hooten, Illene Labrador, MD      . oxyCODONE (Oxy IR/ROXICODONE) immediate release tablet 5-10 mg  5-10 mg Oral Q4H PRN Hooten, Illene Labrador, MD      .  pantoprazole (PROTONIX) EC tablet 40 mg  40 mg Oral BID Hooten, Illene Labrador, MD      . senna-docusate (Senokot-S) tablet 1 tablet  1 tablet Oral BID Hooten, Illene Labrador, MD      . sodium phosphate (FLEET) 7-19 GM/118ML enema 1 enema  1 enema Rectal Once PRN Hooten, Illene Labrador, MD      . traMADol Janean Sark) tablet 50-100 mg  50-100 mg Oral Q4H PRN Hooten, Illene Labrador, MD         Discharge Medications: Please see discharge summary for a list of discharge medications.  Relevant Imaging Results:  Relevant Lab Results:   Additional Information  (SSN: [id number])  Sample, Darleen Crocker, LCSW

## 2017-04-01 NOTE — Op Note (Signed)
OPERATIVE NOTE  DATE OF SURGERY:  04/01/2017  PATIENT NAME:  Richard Davies   DOB: 1951-03-20  MRN: [id number]  PRE-OPERATIVE DIAGNOSIS: Degenerative arthrosis of the right knee, primary  POST-OPERATIVE DIAGNOSIS:  Same  PROCEDURE:  Right total knee arthroplasty using computer-assisted navigation  SURGEON:  Jena Gauss. M.D.  ASSISTANT:  Van Clines, PA (present and scrubbed throughout the case, critical for assistance with exposure, retraction, instrumentation, and closure)  ANESTHESIA: spinal  ESTIMATED BLOOD LOSS: 75 mL  FLUIDS REPLACED: 1200 mL of crystalloid  TOURNIQUET TIME: 96 minutes  DRAINS: 2 medium Hemovac drains  SOFT TISSUE RELEASES: Anterior cruciate ligament, posterior cruciate ligament, deep medial collateral ligament, patellofemoral ligament  IMPLANTS UTILIZED: DePuy Attune size 6 posterior stabilized femoral component (cemented), size 7 rotating platform tibial component (cemented), 38 mm medialized dome patella (cemented), and a 6 mm stabilized rotating platform polyethylene insert.  INDICATIONS FOR SURGERY: Richard Davies is a 66 y.o. year old male with a long history of progressive knee pain. X-rays demonstrated severe degenerative changes. The patient had not seen any significant improvement despite conservative nonsurgical intervention. After discussion of the risks and benefits of surgical intervention, the patient expressed understanding of the risks benefits and agree with plans for total knee arthroplasty.   The risks, benefits, and alternatives were discussed at length including but not limited to the risks of infection, bleeding, nerve injury, stiffness, blood clots, the need for revision surgery, cardiopulmonary complications, among others, and they were willing to proceed.  PROCEDURE IN DETAIL: The patient was brought into the operating room and, after adequate spinal anesthesia was achieved, a tourniquet was placed on the patient's upper  thigh. The patient's knee and leg were cleaned and prepped with alcohol and DuraPrep and draped in the usual sterile fashion. A "timeout" was performed as per usual protocol. The lower extremity was exsanguinated using an Esmarch, and the tourniquet was inflated to 300 mmHg. An anterior longitudinal incision was made followed by a standard mid vastus approach. The deep fibers of the medial collateral ligament were elevated in a subperiosteal fashion off of the medial flare of the tibia so as to maintain a continuous soft tissue sleeve. The patella was subluxed laterally and the patellofemoral ligament was incised. Inspection of the knee demonstrated severe degenerative changes with full-thickness loss of articular cartilage. Osteophytes were debrided using a rongeur. Anterior and posterior cruciate ligaments were excised. Two 4.0 mm Schanz pins were inserted in the femur and into the tibia for attachment of the array of trackers used for computer-assisted navigation. Hip center was identified using a circumduction technique. Distal landmarks were mapped using the computer. The distal femur and proximal tibia were mapped using the computer. The distal femoral cutting guide was positioned using computer-assisted navigation so as to achieve a 5 distal valgus cut. The femur was sized and it was felt that a size 6 femoral component was appropriate. A size 6 femoral cutting guide was positioned and the anterior cut was performed and verified using the computer. This was followed by completion of the posterior and chamfer cuts. Femoral cutting guide for the central box was then positioned in the center box cut was performed.  Attention was then directed to the proximal tibia. Medial and lateral menisci were excised. The extramedullary tibial cutting guide was positioned using computer-assisted navigation so as to achieve a 0 varus-valgus alignment and 3 posterior slope. The cut was performed and verified using the  computer. The proximal tibia was sized and  it was felt that a size 7 tibial tray was appropriate. Tibial and femoral trials were inserted followed by insertion of a 6 mm polyethylene insert. This allowed for excellent mediolateral soft tissue balancing both in flexion and in full extension. Finally, the patella was cut and prepared so as to accommodate a 38 mm medialized dome patella. A patella trial was placed and the knee was placed through a range of motion with excellent patellar tracking appreciated. The femoral trial was removed after debridement of posterior osteophytes. The central post-hole for the tibial component was reamed followed by insertion of a keel punch. Tibial trials were then removed. Cut surfaces of bone were irrigated with copious amounts of normal saline with antibiotic solution using pulsatile lavage and then suctioned dry. Polymethylmethacrylate cement was prepared in the usual fashion using a vacuum mixer. Cement was applied to the cut surface of the proximal tibia as well as along the undersurface of a size 7 rotating platform tibial component. Tibial component was positioned and impacted into place. Excess cement was removed using Personal assistant. Cement was then applied to the cut surfaces of the femur as well as along the posterior flanges of the size 6 femoral component. The femoral component was positioned and impacted into place. Excess cement was removed using Personal assistant. A 6 mm polyethylene trial was inserted and the knee was brought into full extension with steady axial compression applied. Finally, cement was applied to the backside of a 38 mm medialized dome patella and the patellar component was positioned and patellar clamp applied. Excess cement was removed using Personal assistant. After adequate curing of the cement, the tourniquet was deflated after a total tourniquet time of 96 minutes. Hemostasis was achieved using electrocautery. The knee was irrigated with copious  amounts of normal saline with antibiotic solution using pulsatile lavage and then suctioned dry. 20 mL of 1.3% Exparel and 60 mL of 0.25% Marcaine in 40 mL of normal saline was injected along the posterior capsule, medial and lateral gutters, and along the arthrotomy site. A 6 mm stabilized rotating platform polyethylene insert was inserted and the knee was placed through a range of motion with excellent mediolateral soft tissue balancing appreciated and excellent patellar tracking noted. 2 medium drains were placed in the wound bed and brought out through separate stab incisions. The medial parapatellar portion of the incision was reapproximated using interrupted sutures of #1 Vicryl. Subcutaneous tissue was approximated in layers using first #0 Vicryl followed #2-0 Vicryl. The skin was approximated with skin staples. A sterile dressing was applied.  The patient tolerated the procedure well and was transported to the recovery room in stable condition.    James P. Angie Fava., M.D.

## 2017-04-01 NOTE — H&P (Signed)
The patient has been re-examined, and the chart reviewed, and there have been no interval changes to the documented history and physical.    The risks, benefits, and alternatives have been discussed at length. The patient expressed understanding of the risks benefits and agreed with plans for surgical intervention.  Calani Gick P. Patricia Fargo, Jr. M.D.    

## 2017-04-01 NOTE — OR Nursing (Signed)
Lab tech in for abo/rh draw @ 10:18 am

## 2017-04-01 NOTE — Anesthesia Post-op Follow-up Note (Signed)
Anesthesia QCDR form completed.        

## 2017-04-01 NOTE — Progress Notes (Signed)
Pt A&O x4. VSS, but HR running SB (high 40's low 50's) Dr aware.Dressing clean, dry and intact.Pt had spinal anesthesia, could feel sensations in legs, but no complaints of pain at this time. Polar care on and in place. Bone foam in place. Sacral foam on. IV's clean, dry, intact. Wife present upon admission. No belongings except for glasses.

## 2017-04-01 NOTE — Anesthesia Preprocedure Evaluation (Signed)
Anesthesia Evaluation  Patient identified by MRN, date of birth, ID band Patient awake    Reviewed: Allergy & Precautions, NPO status , Patient's Chart, lab work & pertinent test results  History of Anesthesia Complications Negative for: history of anesthetic complications  Airway Mallampati: II       Dental   Pulmonary sleep apnea (not using CPAP) , neg COPD,           Cardiovascular (-) hypertension(-) Past MI and (-) CHF (-) dysrhythmias (-) Valvular Problems/Murmurs     Neuro/Psych neg Seizures    GI/Hepatic Neg liver ROS, neg GERD  ,  Endo/Other  neg diabetes  Renal/GU negative Renal ROS     Musculoskeletal   Abdominal   Peds  Hematology   Anesthesia Other Findings   Reproductive/Obstetrics                             Anesthesia Physical Anesthesia Plan  ASA: II  Anesthesia Plan: Spinal   Post-op Pain Management:    Induction:   PONV Risk Score and Plan:   Airway Management Planned:   Additional Equipment:   Intra-op Plan:   Post-operative Plan:   Informed Consent: I have reviewed the patients History and Physical, chart, labs and discussed the procedure including the risks, benefits and alternatives for the proposed anesthesia with the patient or authorized representative who has indicated his/her understanding and acceptance.     Plan Discussed with:   Anesthesia Plan Comments:         Anesthesia Quick Evaluation

## 2017-04-02 ENCOUNTER — Encounter: Payer: Self-pay | Admitting: Orthopedic Surgery

## 2017-04-02 LAB — BASIC METABOLIC PANEL WITH GFR
Anion gap: 5 (ref 5–15)
BUN: 15 mg/dL (ref 6–20)
CO2: 24 mmol/L (ref 22–32)
Calcium: 8.1 mg/dL — ABNORMAL LOW (ref 8.9–10.3)
Chloride: 106 mmol/L (ref 101–111)
Creatinine, Ser: 0.92 mg/dL (ref 0.61–1.24)
GFR calc Af Amer: >60 mL/min
GFR calc non Af Amer: >60 mL/min
Glucose, Bld: 130 mg/dL — ABNORMAL HIGH (ref 65–99)
Potassium: 4.6 mmol/L (ref 3.5–5.1)
Sodium: 135 mmol/L (ref 135–145)

## 2017-04-02 LAB — CBC
HEMATOCRIT: 38.6 % — AB (ref 40.0–52.0)
Hemoglobin: 13.5 g/dL (ref 13.0–18.0)
MCH: 32 pg (ref 26.0–34.0)
MCHC: 35.1 g/dL (ref 32.0–36.0)
MCV: 91.2 fL (ref 80.0–100.0)
Platelets: 276 10*3/uL (ref 150–440)
RBC: 4.24 MIL/uL — ABNORMAL LOW (ref 4.40–5.90)
RDW: 13.3 % (ref 11.5–14.5)
WBC: 11.7 10*3/uL — ABNORMAL HIGH (ref 3.8–10.6)

## 2017-04-02 NOTE — Evaluation (Signed)
Occupational Therapy Evaluation Patient Details Name: Richard Davies MRN: [id number] DOB: Jul 07, 1951 Today's Date: 04/02/2017    History of Present Illness 66 y/o male s/p R TKA 8/21.   Clinical Impression   Pt is 66 year old male s/p R TKA.  He lives at home with his wife and will have assist from his mother in law after he goes home. 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  Pt currently requires min assist for LB dressing while in seated position due to pain and limited AROM of R knee.  Pt would benefit from instruction in dressing techniques with or without assistive devices for dressing and bathing skills.  Pt would also benefit from recommendations for home modifications to increase safety in the bathroom and prevent falls. Rec continued OT at hospital for dressing skills training but no follow up OT rec at home.     Follow Up Recommendations  No OT follow up    Equipment Recommendations       Recommendations for Other Services       Precautions / Restrictions Precautions Precautions: Knee Restrictions Weight Bearing Restrictions: Yes RLE Weight Bearing: Weight bearing as tolerated      Mobility Bed Mobility Overal bed mobility: Independent                Transfers Overall transfer level: Independent Equipment used: Rolling walker (2 wheeled)             General transfer comment: Pt was able to rise to standing w/o assist and did not need RW to maintain balance    Balance Overall balance assessment: Independent                                         ADL either performed or assessed with clinical judgement   ADL Overall ADL's : Needs assistance/impaired Eating/Feeding: Independent;Set up   Grooming: Wash/dry hands;Wash/dry face;Oral care;Applying deodorant;Brushing hair;Independent;Set up           Upper Body Dressing : Independent;Set up   Lower Body Dressing:  Set up;Minimal assistance;With adaptive equipment                       Vision Patient Visual Report: No change from baseline       Perception     Praxis      Pertinent Vitals/Pain Pain Assessment: 0-10 Pain Score: 2  Pain Location: R knee Pain Descriptors / Indicators: Aching;Dull;Discomfort Pain Intervention(s): Limited activity within patient's tolerance;Monitored during session;Premedicated before session;Repositioned     Hand Dominance Right   Extremity/Trunk Assessment Upper Extremity Assessment Upper Extremity Assessment: Overall WFL for tasks assessed   Lower Extremity Assessment Lower Extremity Assessment: Defer to PT evaluation       Communication Communication Communication: No difficulties   Cognition Arousal/Alertness: Awake/alert Behavior During Therapy: WFL for tasks assessed/performed Overall Cognitive Status: Within Functional Limits for tasks assessed                                     General Comments       Exercises Exercises: Total Joint Total Joint Exercises Ankle Circles/Pumps: AROM;10 reps Quad Sets: Strengthening;10 reps Gluteal Sets: Strengthening;10 reps Heel Slides: Strengthening;10 reps Hip ABduction/ADduction: Strengthening;10  reps Straight Leg Raises: AROM;10 reps Knee Flexion: PROM;5 reps Goniometric ROM: 0-82   Shoulder Instructions      Home Living Family/patient expects to be discharged to:: Private residence Living Arrangements: Spouse/significant other Available Help at Discharge: Family Type of Home: House Home Access: Stairs to enter Secretary/administrator of Steps: 5 Entrance Stairs-Rails: Can reach both Home Layout: One level     Bathroom Shower/Tub: Estate manager/land agent Accessibility: Yes How Accessible: Accessible via walker Home Equipment: Adaptive equipment;Shower seat Adaptive Equipment: Reacher;Sock aid;Long-handled shoe horn        Prior Functioning/Environment  Level of Independence: Independent        Comments: Pt works as a Music therapist, generally active        OT Problem List: Decreased strength;Decreased range of motion;Decreased activity tolerance;Pain      OT Treatment/Interventions: Self-care/ADL training;Therapeutic activities;Patient/family education;DME and/or AE instruction    OT Goals(Current goals can be found in the care plan section) Acute Rehab OT Goals Patient Stated Goal: go home OT Goal Formulation: With patient Time For Goal Achievement: 04/16/17 Potential to Achieve Goals: Good ADL Goals Pt Will Perform Lower Body Dressing: with supervision;sit to/from stand (with or without AD sitting in chair) Pt Will Transfer to Toilet: with supervision;stand pivot transfer;regular height toilet (using FWW)  OT Frequency: Min 1X/week   Barriers to D/C:            Co-evaluation              AM-PAC PT "6 Clicks" Daily Activity     Outcome Measure Help from another person eating meals?: None Help from another person taking care of personal grooming?: None Help from another person toileting, which includes using toliet, bedpan, or urinal?: A Little Help from another person bathing (including washing, rinsing, drying)?: A Little Help from another person to put on and taking off regular upper body clothing?: None Help from another person to put on and taking off regular lower body clothing?: A Little 6 Click Score: 21   End of Session    Activity Tolerance: Patient tolerated treatment well Patient left: in chair;with call bell/phone within reach;with chair alarm set  OT Visit Diagnosis: Pain;Muscle weakness (generalized) (M62.81) Pain - Right/Left: Right Pain - part of body: Knee                Time: 1100-1130 OT Time Calculation (min): 30 min Charges:  OT General Charges $OT Visit: 1 Procedure OT Evaluation $OT Eval Low Complexity: 1 Procedure OT Treatments $Self Care/Home Management : 8-22 mins G-Codes: OT  G-codes **NOT FOR INPATIENT CLASS** Functional Assessment Tool Used: AM-PAC 6 Clicks Daily Activity;Clinical judgement   Susanne Borders, OTR/L ascom [phone] 04/02/17, 2:05 PM

## 2017-04-02 NOTE — Evaluation (Signed)
Physical Therapy Evaluation Patient Details Name: Richard Davies MRN: [id number] DOB: [birth year] Today's Date: 04/02/2017   History of Present Illness  66 y/o male s/p R TKA 8/21.  Clinical Impression  Pt did very well with POD1 examination, he was able to ambulate 75 ft with constant walker movement - no knee buckling, no excessive fatigue, only minimal pain increase.  He was able to easily do SLRs w/o warm up, showed good confidence with ~10 minutes of exercises apart from the exam and though he had expected post-op pain is was not functionally limiting.     Follow Up Recommendations Outpatient PT    Equipment Recommendations  Rolling walker with 5" wheels    Recommendations for Other Services       Precautions / Restrictions Precautions Precautions: Knee Restrictions Weight Bearing Restrictions: Yes RLE Weight Bearing: Weight bearing as tolerated      Mobility  Bed Mobility Overal bed mobility: Independent                Transfers Overall transfer level: Independent Equipment used: Rolling walker (2 wheeled)             General transfer comment: Pt was able to rise to standing w/o assist and did not need RW to maintain balance  Ambulation/Gait Ambulation/Gait assistance: Supervision Ambulation Distance (Feet): 75 Feet Assistive device: Rolling walker (2 wheeled)       General Gait Details: Pt was able to ambulate with consistent speed and with only very minimal hesitancy.  He showed great effort, minimal fatigue and ultimately did very well for first time walking since TKA  Stairs            Wheelchair Mobility    Modified Rankin (Stroke Patients Only)       Balance Overall balance assessment: Independent                                           Pertinent Vitals/Pain Pain Assessment: 0-10 Pain Score: 2  (increases to 5/10 with activity, higher with ROM acts)    Home Living Family/patient expects to be discharged  to:: Private residence Living Arrangements: Spouse/significant other     Home Access: Stairs to enter Entrance Stairs-Rails: Can reach both Entrance Stairs-Number of Steps: 5          Prior Function Level of Independence: Independent         Comments: Pt works as a Music therapist, generally active     Higher education careers adviser        Extremity/Trunk Assessment   Upper Extremity Assessment Upper Extremity Assessment: Overall WFL for tasks assessed    Lower Extremity Assessment Lower Extremity Assessment: Overall WFL for tasks assessed (Pt with good R quad control, did SLRs, good POD1 strength)       Communication   Communication: No difficulties  Cognition Arousal/Alertness: Awake/alert Behavior During Therapy: WFL for tasks assessed/performed Overall Cognitive Status: Within Functional Limits for tasks assessed                                        General Comments      Exercises Total Joint Exercises Ankle Circles/Pumps: AROM;10 reps Quad Sets: Strengthening;10 reps Gluteal Sets: Strengthening;10 reps Heel Slides: Strengthening;10 reps Hip ABduction/ADduction: Strengthening;10 reps Straight Leg Raises: AROM;10 reps Knee  Flexion: PROM;5 reps Goniometric ROM: 0-82   Assessment/Plan    PT Assessment Patient needs continued PT services  PT Problem List Decreased range of motion;Decreased strength;Decreased activity tolerance;Decreased balance;Decreased mobility;Decreased knowledge of use of DME;Decreased safety awareness;Pain       PT Treatment Interventions DME instruction;Gait training;Functional mobility training;Stair training;Therapeutic activities;Therapeutic exercise;Balance training;Neuromuscular re-education;Patient/family education    PT Goals (Current goals can be found in the Care Plan section)  Acute Rehab PT Goals Patient Stated Goal: go home PT Goal Formulation: With patient Time For Goal Achievement: 04/16/17 Potential to Achieve  Goals: Good    Frequency BID   Barriers to discharge        Co-evaluation               AM-PAC PT "6 Clicks" Daily Activity  Outcome Measure Difficulty turning over in bed (including adjusting bedclothes, sheets and blankets)?: None Difficulty moving from lying on back to sitting on the side of the bed? : None Difficulty sitting down on and standing up from a chair with arms (e.g., wheelchair, bedside commode, etc,.)?: None Help needed moving to and from a bed to chair (including a wheelchair)?: None Help needed walking in hospital room?: None Help needed climbing 3-5 steps with a railing? : A Little 6 Click Score: 23    End of Session Equipment Utilized During Treatment: Gait belt Activity Tolerance: Patient tolerated treatment well Patient left: with chair alarm set;with call bell/phone within reach Nurse Communication: Mobility status PT Visit Diagnosis: Muscle weakness (generalized) (M62.81);Difficulty in walking, not elsewhere classified (R26.2)    Time: 4098-1191 PT Time Calculation (min) (ACUTE ONLY): 32 min   Charges:   PT Evaluation $PT Eval Low Complexity: 1 Low PT Treatments $Therapeutic Exercise: 8-22 mins   PT G Codes:   PT G-Codes **NOT FOR INPATIENT CLASS** Functional Assessment Tool Used: Clinical judgement Functional Limitation: Mobility: Walking and moving around Mobility: Walking and Moving Around Current Status (Y7829): At least 20 percent but less than 40 percent impaired, limited or restricted Mobility: Walking and Moving Around Goal Status [id number]): At least 1 percent but less than 20 percent impaired, limited or restricted    Malachi Pro, DPT 04/02/2017, 11:28 AM

## 2017-04-02 NOTE — Progress Notes (Signed)
   Subjective: 1 Day Post-Op Procedure(s) (LRB): COMPUTER ASSISTED TOTAL KNEE ARTHROPLASTY (Right) Patient reports pain as 0 on 0-10 scale.   Patient is well, and has had no acute complaints or problems We will start therapy today.  Plan is to go Home after hospital stay. no nausea and no vomiting Patient denies any chest pains or shortness of breath. Objective: Vital signs in last 24 hours: Temp:  [96.2 F (35.7 C)-98.3 F (36.8 C)] 96.2 F (35.7 C) (08/21 0429) Pulse Rate:  [44-65] 46 (08/21 0429) Resp:  [14-19] 19 (08/21 0429) BP: (91-147)/(60-86) 117/70 (08/21 0429) SpO2:  [98 %-100 %] 98 % (08/21 0429) Weight:  [83.9 kg (185 lb)] 83.9 kg (185 lb) (08/20 1012) Original dressing still in place Heels are non tender and elevated off the bed using rolled towels along with bone foam under operative leg  Intake/Output from previous day: 08/20 0701 - 08/21 0700 In: 1560 [P.O.:60; I.V.:1400; IV Piggyback:100] Out: 2405 [Urine:1750; Drains:580; Blood:75] Intake/Output this shift: No intake/output data recorded.   Recent Labs  04/02/17 0418  HGB 13.5    Recent Labs  04/02/17 0418  WBC 11.7*  RBC 4.24*  HCT 38.6*  PLT 276    Recent Labs  04/02/17 0418  NA 135  K 4.6  CL 106  CO2 24  BUN 15  CREATININE 0.92  GLUCOSE 130*  CALCIUM 8.1*   No results for input(s): LABPT, INR in the last 72 hours.  EXAM General - Patient is Alert, Appropriate and Oriented Extremity - Neurologically intact Neurovascular intact Sensation intact distally Intact pulses distally Dorsiflexion/Plantar flexion intact Compartment soft Dressing - dressing C/D/I Motor Function - intact, moving foot and toes well on exam. Able to do straight leg raise on his own  Past Medical History:  Diagnosis Date  . Arthritis   . Sleep apnea    NO CPAP    Assessment/Plan: 1 Day Post-Op Procedure(s) (LRB): COMPUTER ASSISTED TOTAL KNEE ARTHROPLASTY (Right) Active Problems:   S/P total knee  arthroplasty  Estimated body mass index is 27.32 kg/m as calculated from the following:   Height as of this encounter: 5\' 9"  (1.753 m).   Weight as of this encounter: 83.9 kg (185 lb). Advance diet Up with therapy D/C IV fluids Plan for discharge tomorrow Discharge home with home health  Labs: Were reviewed and acceptable DVT Prophylaxis - Lovenox, Foot Pumps and TED hose Weight-Bearing as tolerated to right leg D/C O2 and Pulse OX and try on Room Air Begin working on bowel movement. Labs tomorrow morning  Lynnda Shields. [facility] PA [facility] 04/02/2017, 7:45 AM

## 2017-04-02 NOTE — Care Management Note (Addendum)
Case Management Note  Patient Details  Name: Richard Davies MRN: [id number] Date of Birth: 1951/02/26  Subjective/Objective:  POD # 1 right TKA. RNCM consult for discharge planning. Met with patient at bedside. PT recommending OP PT. Scheduled OP PT appointment with [facility] on Thursday August 23rd @ 11:30. Patient updated and added to follow u appointments on discharge instructions.  Will need a walker. [facility] from [facility]: [facility] [phone]. Call Lovenox 40 mg # 14 no refills. Patient lives with his wife. He will be cared for by her and his mother in law. PCP Dr. Netty Starring.                     Action/Plan: OP PT. Walker from [facility]. Lovenox called in.   Expected Discharge Date:  04/03/17               Expected Discharge Plan:  OP Rehab  In-House Referral:     Discharge planning Services  CM Consult  Post Acute Care Choice:  Durable Medical Equipment Choice offered to:  Patient  DME Arranged:  [place]ford Rile rolling DME Agency:  [place]:    [facility] Agency:     Status of Service:  In process, will continue to follow  If discussed at Long Length of Stay Meetings, dates discussed:    Additional Comments:  Jolly Mango, RN 04/02/2017, 10:00 AM

## 2017-04-02 NOTE — Progress Notes (Signed)
Clinical Social Worker (CSW) received SNF consult. PT is recommending outpatient PT. RN case manager is aware of above. Please reconsult if future social work needs arise. CSW signing off.   Shenee Wignall, LCSW [phone]   

## 2017-04-02 NOTE — Discharge Instructions (Signed)

## 2017-04-02 NOTE — Anesthesia Postprocedure Evaluation (Signed)
Anesthesia Post Note  Patient: DAIVON PERFETTI  Procedure(s) Performed: Procedure(s) (LRB): COMPUTER ASSISTED TOTAL KNEE ARTHROPLASTY (Right)  Patient location during evaluation: Nursing Unit Anesthesia Type: Spinal Level of consciousness: oriented and awake and alert Pain management: pain level controlled Vital Signs Assessment: post-procedure vital signs reviewed and stable Respiratory status: spontaneous breathing, respiratory function stable and patient connected to nasal cannula oxygen Cardiovascular status: blood pressure returned to baseline and stable Postop Assessment: no headache and no backache Anesthetic complications: no     Last Vitals:  Vitals:   04/02/17 0023 04/02/17 0429  BP: 108/69 117/70  Pulse: (!) 47 (!) 46  Resp: 18 19  Temp: (!) 36.1 C (!) 35.7 C  SpO2: 98% 98%    Last Pain:  Vitals:   04/02/17 0624  TempSrc:   PainSc: 7                  Jules Schick

## 2017-04-02 NOTE — Discharge Summary (Signed)
Physician Discharge Summary  Patient ID: Richard Davies MRN: [id number] DOB/AGE: 1951-04-15 66 y.o.  Admit date: 04/01/2017 Discharge date: 04/03/2017  Admission Diagnoses:  PRIMARY OSTEOARTHRITIS OF RIGHT KNEE   Discharge Diagnoses: Patient Active Problem List   Diagnosis Date Noted  . S/P total knee arthroplasty 04/01/2017    Past Medical History:  Diagnosis Date  . Arthritis   . Sleep apnea    NO CPAP     Transfusion: No transfusions during this admission   Consultants (if any):   Discharged Condition: Improved  Hospital Course: Richard Davies is an 66 y.o. male who was admitted 04/01/2017 with a diagnosis of degenerative arthrosis right knee and went to the operating room on 04/01/2017 and underwent the above named procedures.    Surgeries:Procedure(s): COMPUTER ASSISTED TOTAL KNEE ARTHROPLASTY on 04/01/2017  PRE-OPERATIVE DIAGNOSIS: Degenerative arthrosis of the right knee, primary  POST-OPERATIVE DIAGNOSIS:  Same  PROCEDURE:  Right total knee arthroplasty using computer-assisted navigation  SURGEON:  Jena Gauss. M.D.  ASSISTANT:  Van Clines, PA (present and scrubbed throughout the case, critical for assistance with exposure, retraction, instrumentation, and closure)  ANESTHESIA: spinal  ESTIMATED BLOOD LOSS: 75 mL  FLUIDS REPLACED: 1200 mL of crystalloid  TOURNIQUET TIME: 96 minutes  DRAINS: 2 medium Hemovac drains  SOFT TISSUE RELEASES: Anterior cruciate ligament, posterior cruciate ligament, deep medial collateral ligament, patellofemoral ligament  IMPLANTS UTILIZED: DePuy Attune size 6 posterior stabilized femoral component (cemented), size 7 rotating platform tibial component (cemented), 38 mm medialized dome patella (cemented), and a 6 mm stabilized rotating platform polyethylene insert.  INDICATIONS FOR SURGERY: Richard Davies is a 66 y.o. year old male with a long history of progressive knee pain. X-rays demonstrated  severe degenerative changes. The patient had not seen any significant improvement despite conservative nonsurgical intervention. After discussion of the risks and benefits of surgical intervention, the patient expressed understanding of the risks benefits and agree with plans for total knee arthroplasty.   The risks, benefits, and alternatives were discussed at length including but not limited to the risks of infection, bleeding, nerve injury, stiffness, blood clots, the need for revision surgery, cardiopulmonary complications, among others, and they were willing to proceed. Patient tolerated the surgery well. No complications .Patient was taken to PACU where she was stabilized and then transferred to the orthopedic floor.  Patient started on Lovenox 30 mg q 12 hrs. Foot pumps applied bilaterally at 80 mm hgb. Heels elevated off bed with rolled towels. No evidence of DVT. Calves non tender. Negative Homan. Physical therapy started on day #1 for gait training and transfer with OT starting on  day #1 for ADL and assisted devices. Patient has done well with therapy. Ambulated greater than 200 feet upon being discharged.  Patient's IV And Foley were discontinued on day #1 with Hemovac being discontinued on day #2. Dressing was changed on day 2 prior to patient being discharged   He was given perioperative antibiotics:  Anti-infectives    Start     Dose/Rate Route Frequency Ordered Stop   04/01/17 1700  clindamycin (CLEOCIN) IVPB 600 mg     600 mg 100 mL/hr over 30 Minutes Intravenous Every 6 hours 04/01/17 1559 04/02/17 1659   04/01/17 0600  clindamycin (CLEOCIN) IVPB 900 mg  Status:  Discontinued     900 mg 100 mL/hr over 30 Minutes Intravenous On call to O.R. 03/31/17 2135 04/01/17 1003    .  He was fitted with AV 1 compression foot pump  devices, instructed on heel pumps, early ambulation, and fitted with TED stockings bilaterally for DVT prophylaxis.  He benefited maximally from the hospital  stay and there were no complications.    Recent vital signs:  Vitals:   04/02/17 0023 04/02/17 0429  BP: 108/69 117/70  Pulse: (!) 47 (!) 46  Resp: 18 19  Temp: (!) 96.9 F (36.1 C) (!) 96.2 F (35.7 C)  SpO2: 98% 98%    Recent laboratory studies:  Lab Results  Component Value Date   HGB 13.5 04/02/2017   HGB 14.8 03/20/2017   Lab Results  Component Value Date   WBC 11.7 (H) 04/02/2017   PLT 276 04/02/2017   Lab Results  Component Value Date   INR 0.88 03/20/2017   Lab Results  Component Value Date   NA 135 04/02/2017   K 4.6 04/02/2017   CL 106 04/02/2017   CO2 24 04/02/2017   BUN 15 04/02/2017   CREATININE 0.92 04/02/2017   GLUCOSE 130 (H) 04/02/2017    Discharge Medications:   Allergies as of 04/03/2017      Reactions   Codeine Nausea Only   Ciprofloxacin Other (See Comments)   Joint pain   Penicillins Rash   Has patient had a PCN reaction causing immediate rash, facial/tongue/throat swelling, SOB or lightheadedness with hypotension: Yes Has patient had a PCN reaction causing severe rash involving mucus membranes or skin necrosis: No Has patient had a PCN reaction that required hospitalization No Has patient had a PCN reaction occurring within the last 10 years: No If all of the above answers are "NO", then may proceed with Cephalosporin use.      Medication List    STOP taking these medications   naproxen sodium 220 MG tablet Commonly known as:  ANAPROX     TAKE these medications   calcium carbonate 1250 (500 Ca) MG chewable tablet Commonly known as:  OS-CAL Chew 1 tablet by mouth as needed for heartburn.   enoxaparin 30 MG/0.3ML injection Commonly known as:  LOVENOX Inject 0.4 mLs (40 mg total) into the skin daily.   oxyCODONE 5 MG immediate release tablet Commonly known as:  Oxy IR/ROXICODONE Take 1-2 tablets (5-10 mg total) by mouth every 4 (four) hours as needed for severe pain or breakthrough pain.   sildenafil 20 MG tablet Commonly  known as:  REVATIO Take 20 mg by mouth 3 (three) times daily. 3-5 tablets per day as needed.   traMADol 50 MG tablet Commonly known as:  ULTRAM Take 1-2 tablets (50-100 mg total) by mouth every 4 (four) hours as needed for moderate pain.            Durable Medical Equipment        Start     Ordered   04/01/17 1559  DME Walker rolling  Once    Question:  Patient needs a walker to treat with the following condition  Answer:  Total knee replacement status   04/01/17 1559   04/01/17 1559  DME Bedside commode  Once    Question:  Patient needs a bedside commode to treat with the following condition  Answer:  Total knee replacement status   04/01/17 1559      Diagnostic Studies: Dg Knee Right Port  Result Date: 04/01/2017 CLINICAL DATA:  Status post right total knee joint prosthesis placement. EXAM: PORTABLE RIGHT KNEE - 1-2 VIEW COMPARISON:  None in PACs FINDINGS: The patient has undergone right total knee joint prosthesis placement. Radiographic positioning of  the prosthetic components is good. The interface with the native bone is normal. In the distal femoral shaft and in the proximal tibial shaft and metaphysis there are screw track tunnels visible from previously removed screws. There are surgical drain lines and skin staples present. IMPRESSION: No immediate postprocedure complication following right total knee joint prosthesis placement. Electronically Signed   By: David  [place] M.D.   On: 04/01/2017 15:17    Disposition: 01-Home or Self Care    Follow-up Information    Anson [place], PA-C On 04/17/2017.   Specialty:  Physician Assistant Why:  at 10:45am Contact information: [address] [place] [address] [phone]        Donato Heinz, MD On 05/14/2017.   Specialty:  Orthopedic Surgery Why:  at 9:45am Contact information: [address] [place] [facility] [place] [address] [phone]             Signed: Tera Partridge. 04/02/2017, 7:50 AM

## 2017-04-02 NOTE — Progress Notes (Signed)
Physical Therapy Treatment Patient Details Name: Richard Davies MRN: [id number] DOB: 07-Jun-1951 Today's Date: 04/02/2017    History of Present Illness 66 y/o male s/p R TKA 8/21.    PT Comments    Pt agreeable to PT; reports 4/10 pain, which increases to 5 with ambulation. Session split due to pt needs. Performs exercises well with re education. Requires instruction for transfer to improve use of Right lower extremity, as pt was hardly using; improved well with correction and explanation. Pt educated on open versus closed knee position for swelling management. Also educated in proper activity level and frequency of activity and exercise at home. Progressing ambulation quality and distance. Performed steps well post instruction. Continue PT to progress range, strength, endurance to improve all functional mobility and allow for an optimal safe return home.    Follow Up Recommendations  Outpatient PT     Equipment Recommendations  Rolling walker with 5" wheels    Recommendations for Other Services       Precautions / Restrictions Precautions Precautions: Knee Precaution Comments: knee Restrictions Weight Bearing Restrictions: Yes RLE Weight Bearing: Weight bearing as tolerated    Mobility  Bed Mobility               General bed mobility comments: not tested; up in chair  Transfers Overall transfer level: Needs assistance Equipment used: Rolling walker (2 wheeled) Transfers: Sit to/from Stand Sit to Stand: Modified independent (Device/Increase time)         General transfer comment: Initially needs cues for proper use of RLE versus limiting use. Improved post education/demonstration  Ambulation/Gait Ambulation/Gait assistance: Supervision Ambulation Distance (Feet): 360 Feet Assistive device: Rolling walker (2 wheeled) Gait Pattern/deviations: Step-through pattern   Gait velocity interpretation: Below normal speed for age/gender General Gait Details: fluid  pattern; cues for improved R knee flexion on swing phase and QS with stance phase; good corrections   Stairs Stairs: Yes   Stair Management: Two rails;Step to pattern;Forwards Number of Stairs: 6 General stair comments: Educated in sidestepping as well. Safe, no issues  Wheelchair Mobility    Modified Rankin (Stroke Patients Only)       Balance                                            Cognition Arousal/Alertness: Awake/alert Behavior During Therapy: WFL for tasks assessed/performed Overall Cognitive Status: Within Functional Limits for tasks assessed                                        Exercises Total Joint Exercises Quad Sets: Strengthening;Both;20 reps (also 10 in stand) Knee Flexion: AROM;Right;10 reps;Seated (4 positions ea rep with 10 sec hold each) Marching in Standing: AROM;Strengthening;Right;20 reps    General Comments        Pertinent Vitals/Pain Pain Assessment: 0-10 Pain Score: 4  (5 with ambulation) Pain Location: R knee Pain Descriptors / Indicators: Aching;Dull;Discomfort Pain Intervention(s): Monitored during session    Home Living Family/patient expects to be discharged to:: Private residence Living Arrangements: Spouse/significant other Available Help at Discharge: Family Type of Home: House Home Access: Stairs to enter Entrance Stairs-Rails: Can reach both Home Layout: One level Home Equipment: Adaptive equipment;Shower seat      Prior Function Level of Independence: Independent  Comments: Pt works as a Music therapist, generally active   PT Goals (current goals can now be found in the care plan section) Acute Rehab PT Goals Patient Stated Goal: go home Progress towards PT goals: Progressing toward goals    Frequency    BID      PT Plan Current plan remains appropriate    Co-evaluation              AM-PAC PT "6 Clicks" Daily Activity  Outcome Measure  Difficulty turning over  in bed (including adjusting bedclothes, sheets and blankets)?: None Difficulty moving from lying on back to sitting on the side of the bed? : None Difficulty sitting down on and standing up from a chair with arms (e.g., wheelchair, bedside commode, etc,.)?: A Little Help needed moving to and from a bed to chair (including a wheelchair)?: None Help needed walking in hospital room?: None Help needed climbing 3-5 steps with a railing? : A Little 6 Click Score: 22    End of Session Equipment Utilized During Treatment: Gait belt Activity Tolerance: Patient tolerated treatment well Patient left: in chair;with call bell/phone within reach;with chair alarm set;Other (comment) (polar care)   PT Visit Diagnosis: Muscle weakness (generalized) (M62.81);Difficulty in walking, not elsewhere classified (R26.2)     Time: 4098-1191 (also 1627 to 1654) (27 min) PT Time Calculation (min) (ACUTE ONLY): 25 min (total 52 minutes)  Charges:  $Gait Training: 23-37 mins $Therapeutic Exercise: 8-22 mins                    G Codes:        Scot Dock, PTA 04/02/2017, 5:01 PM

## 2017-04-03 ENCOUNTER — Encounter: Payer: Self-pay | Admitting: Orthopedic Surgery

## 2017-04-03 LAB — CBC
HCT: 35.7 % — ABNORMAL LOW (ref 40.0–52.0)
HEMOGLOBIN: 12.5 g/dL — AB (ref 13.0–18.0)
MCH: 32.4 pg (ref 26.0–34.0)
MCHC: 35.1 g/dL (ref 32.0–36.0)
MCV: 92.2 fL (ref 80.0–100.0)
PLATELETS: 255 10*3/uL (ref 150–440)
RBC: 3.87 MIL/uL — ABNORMAL LOW (ref 4.40–5.90)
RDW: 13 % (ref 11.5–14.5)
WBC: 10.5 10*3/uL (ref 3.8–10.6)

## 2017-04-03 LAB — BASIC METABOLIC PANEL
Anion gap: 5 (ref 5–15)
BUN: 16 mg/dL (ref 6–20)
CALCIUM: 8 mg/dL — AB (ref 8.9–10.3)
CHLORIDE: 108 mmol/L (ref 101–111)
CO2: 26 mmol/L (ref 22–32)
CREATININE: 1.05 mg/dL (ref 0.61–1.24)
Glucose, Bld: 97 mg/dL (ref 65–99)
Potassium: 3.9 mmol/L (ref 3.5–5.1)
SODIUM: 139 mmol/L (ref 135–145)

## 2017-04-03 MED ORDER — LACTULOSE 10 GM/15ML PO SOLN
10.0000 g | Freq: Two times a day (BID) | ORAL | Status: DC | PRN
Start: 2017-04-03 — End: 2017-04-03

## 2017-04-03 MED ORDER — OXYCODONE HCL 5 MG PO TABS: 5.0000 mg | ORAL_TABLET | ORAL | 0 refills | Status: DC | PRN

## 2017-04-03 MED ORDER — ENOXAPARIN SODIUM 30 MG/0.3ML ~~LOC~~ SOLN: 40.0000 mg | SUBCUTANEOUS | 0 refills | Status: DC

## 2017-04-03 MED ORDER — TRAMADOL HCL 50 MG PO TABS: 50.0000 mg | ORAL_TABLET | ORAL | 0 refills | Status: DC | PRN

## 2017-04-03 NOTE — Progress Notes (Signed)
   Subjective: 2 Days Post-Op Procedure(s) (LRB): COMPUTER ASSISTED TOTAL KNEE ARTHROPLASTY (Right) Patient reports pain as mild.   Patient is well, and has had no acute complaints or problems Continue with therapy Plan is to go Home after hospital stay. no nausea and no vomiting Patient denies any chest pains or shortness of breath. Objective: Vital signs in last 24 hours: Temp:  [97.4 F (36.3 C)-98 F (36.7 C)] 98 F (36.7 C) (08/21 2040) Pulse Rate:  [50-60] 50 (08/21 2040) Resp:  [14-18] 18 (08/21 2040) BP: (113-119)/(65-77) 119/76 (08/21 2040) SpO2:  [98 %-100 %] 100 % (08/21 2040) well approximated incision Heels are non tender and elevated off the bed using rolled towels Intake/Output from previous day: 08/21 0701 - 08/22 0700 In: 1020 [P.O.:840; I.V.:180] Out: 800 [Urine:800] Intake/Output this shift: No intake/output data recorded.   Recent Labs  04/02/17 0418 04/03/17 0414  HGB 13.5 12.5*    Recent Labs  04/02/17 0418 04/03/17 0414  WBC 11.7* 10.5  RBC 4.24* 3.87*  HCT 38.6* 35.7*  PLT 276 255    Recent Labs  04/02/17 0418 04/03/17 0414  NA 135 139  K 4.6 3.9  CL 106 108  CO2 24 26  BUN 15 16  CREATININE 0.92 1.05  GLUCOSE 130* 97  CALCIUM 8.1* 8.0*   No results for input(s): LABPT, INR in the last 72 hours.  EXAM General - Patient is Alert, Appropriate and Oriented Extremity - Neurologically intact Neurovascular intact Sensation intact distally Intact pulses distally Dorsiflexion/Plantar flexion intact No cellulitis present Compartment soft Dressing - moderate drainage Motor Function - intact, moving foot and toes well on exam.    Past Medical History:  Diagnosis Date  . Arthritis   . Sleep apnea    NO CPAP    Assessment/Plan: 2 Days Post-Op Procedure(s) (LRB): COMPUTER ASSISTED TOTAL KNEE ARTHROPLASTY (Right) Active Problems:   S/P total knee arthroplasty  Estimated body mass index is 27.32 kg/m as calculated from  the following:   Height as of this encounter: 5\' 9"  (1.753 m).   Weight as of this encounter: 83.9 kg (185 lb). Up with therapy Discharge home with home health  Labs: reviewed DVT Prophylaxis - Lovenox, Foot Pumps and TED hose hemovac d/c'd Dressing changed D/c to home after therapy this am D/C O2 and Pulse OX and try on Room [employer] [facility] PA [facility] 04/03/2017, 6:47 AM

## 2017-04-03 NOTE — Progress Notes (Signed)
Discharge note:  Pt given discharge instructions and prescriptions (oxycodone and Tramadol). Lovenox teaching done with pt.  Pt verbalized understanding. Pt given 2 extra surgical dressing, both TED hose on, walker sent with pt. Pt wheeled out to car by staff.

## 2017-04-03 NOTE — Care Management Note (Signed)
Case Management Note  Patient Details  Name: Richard Davies MRN: [id number] Date of Birth: 01/14/1951  Subjective/Objective:  Discharging today                  Action/Plan: Cost of Lovenox is $ 148.98. Patient updated and denies issues paying for medication. OP PT scheduled and patient is aware of date and time. DME delivered.   Expected Discharge Date:  04/03/17               Expected Discharge Plan:  OP Rehab  In-House Referral:     Discharge planning Services  CM Consult  Post Acute Care Choice:  Durable Medical Equipment Choice offered to:  Patient  DME Arranged:  Dan Humphreys rolling DME Agency:  [facility]  HH Arranged:    [facility] Agency:     Status of Service:  Completed, signed off  If discussed at Long Length of Stay Meetings, dates discussed:    Additional Comments:  Marily Memos, RN 04/03/2017, 8:04 AM

## 2017-04-03 NOTE — Progress Notes (Signed)
Physical Therapy Treatment Patient Details Name: Richard Davies MRN: [id number] DOB: 1951-04-13 Today's Date: 04/03/2017    History of Present Illness 66 y/o male s/p R TKA 8/21.    PT Comments    Pt agreeable to PT; reports little pain, 2/10. Pt demonstrating Mod I with transfers and ambulation. Pt/spouse educated on stand Right lower extremity exercises as well as carryover to home for stretching, activity versus rest and progression of exercise and ambulation/use of assistive device. Pt/spouse verbalize understanding. At the time of this document, pt discharged home.   Follow Up Recommendations  Outpatient PT     Equipment Recommendations  Rolling walker with 5" wheels    Recommendations for Other Services       Precautions / Restrictions Precautions Precautions: Knee Precaution Comments: knee Restrictions Weight Bearing Restrictions: Yes RLE Weight Bearing: Weight bearing as tolerated    Mobility  Bed Mobility               General bed mobility comments: not tested; up in chair  Transfers Overall transfer level: Modified independent Equipment used: Rolling walker (2 wheeled) Transfers: Sit to/from Stand Sit to Stand: Modified independent (Device/Increase time)            Ambulation/Gait Ambulation/Gait assistance: Supervision Ambulation Distance (Feet): 200 Feet Assistive device: Rolling walker (2 wheeled) Gait Pattern/deviations: Step-through pattern Gait velocity: decreased Gait velocity interpretation: Below normal speed for age/gender General Gait Details: fluid pattern; cues for improved R knee flexion on swing phase and QS with stance phase; good corrections   Stairs            Wheelchair Mobility    Modified Rankin (Stroke Patients Only)       Balance                                            Cognition Arousal/Alertness: Awake/alert Behavior During Therapy: WFL for tasks assessed/performed Overall  Cognitive Status: Within Functional Limits for tasks assessed                                        Exercises Total Joint Exercises Quad Sets: Strengthening;Right;10 reps;Standing Hip ABduction/ADduction: Strengthening;Right;10 reps;Standing Straight Leg Raises: Strengthening;Right;10 reps;Standing Knee Flexion: AROM;Right;10 reps;Seated (4 positions ea rep with 10 sec hold each) Goniometric ROM: 0-85 Marching in Standing: AROM;Strengthening;Right;10 reps;Standing Standing Hip Extension: AROM;Strengthening;Right;10 reps;Standing Other Exercises Other Exercises: R ham curl 10x standing Other Exercises: Educated on carryover to home for stretching and re educated on activity versus rest with pt and spouse educated as well.     General Comments        Pertinent Vitals/Pain Pain Assessment: 0-10 Pain Score: 2  Pain Location: R knee    Home Living                      Prior Function            PT Goals (current goals can now be found in the care plan section) Progress towards PT goals: Goals met/education completed, patient discharged from PT    Frequency    BID      PT Plan Current plan remains appropriate    Co-evaluation              AM-PAC PT "6  Clicks" Daily Activity  Outcome Measure  Difficulty turning over in bed (including adjusting bedclothes, sheets and blankets)?: None Difficulty moving from lying on back to sitting on the side of the bed? : None Difficulty sitting down on and standing up from a chair with arms (e.g., wheelchair, bedside commode, etc,.)?: None Help needed moving to and from a bed to chair (including a wheelchair)?: None Help needed walking in hospital room?: None Help needed climbing 3-5 steps with a railing? : A Little 6 Click Score: 23    End of Session Equipment Utilized During Treatment: Gait belt Activity Tolerance: Patient tolerated treatment well Patient left: in chair;with call bell/phone  within reach;with chair alarm set;Other (comment);with family/visitor present (polar care)   PT Visit Diagnosis: Muscle weakness (generalized) (M62.81);Difficulty in walking, not elsewhere classified (R26.2)     Time: 1019-1100 PT Time Calculation (min) (ACUTE ONLY): 41 min  Charges:  $Gait Training: 8-22 mins $Therapeutic Exercise: 23-37 mins                    G Codes:        Larae Grooms, PTA 04/03/2017, 11:23 AM

## 2017-05-02 ENCOUNTER — Encounter: Payer: Self-pay | Admitting: *Deleted

## 2017-07-01 ENCOUNTER — Encounter: Payer: Self-pay | Admitting: *Deleted

## 2017-07-02 ENCOUNTER — Encounter: Payer: Self-pay | Admitting: *Deleted

## 2017-07-02 ENCOUNTER — Ambulatory Visit
Admission: RE | Admit: 2017-07-02 | Discharge: 2017-07-02 | Disposition: A | Discharge status: Home / Self Care | Payer: Commercial Managed Care - HMO | Source: Ambulatory Visit | Attending: Gastroenterology | Admitting: Gastroenterology

## 2017-07-02 ENCOUNTER — Ambulatory Visit: Payer: Commercial Managed Care - HMO | Admitting: Anesthesiology

## 2017-07-02 ENCOUNTER — Encounter
Admission: RE | Disposition: A | Discharge status: Home / Self Care | Payer: Self-pay | Source: Ambulatory Visit | Attending: Gastroenterology

## 2017-07-02 DIAGNOSIS — K5730 Diverticulosis of large intestine without perforation or abscess without bleeding: Secondary | ICD-10-CM | POA: Diagnosis not present

## 2017-07-02 DIAGNOSIS — Z86010 Personal history of colonic polyps: Secondary | ICD-10-CM | POA: Diagnosis present

## 2017-07-02 DIAGNOSIS — Z880 Allergy status to penicillin: Secondary | ICD-10-CM | POA: Diagnosis not present

## 2017-07-02 DIAGNOSIS — G4730 Sleep apnea, unspecified: Secondary | ICD-10-CM | POA: Insufficient documentation

## 2017-07-02 HISTORY — PX: COLONOSCOPY WITH PROPOFOL: SHX5780

## 2017-07-02 HISTORY — DX: Other allergy status, other than to drugs and biological substances: Z91.09

## 2017-07-02 HISTORY — DX: Unilateral primary osteoarthritis, unspecified knee: M17.10

## 2017-07-02 HISTORY — DX: Other articular cartilage disorders, left shoulder: M24.112

## 2017-07-02 SURGERY — COLONOSCOPY WITH PROPOFOL
Anesthesia: General

## 2017-07-02 MED ORDER — SODIUM CHLORIDE 0.9 % IV SOLN: INTRAVENOUS | Status: DC

## 2017-07-02 MED ORDER — CLINDAMYCIN PHOSPHATE 300 MG/2ML IJ SOLN: 600.0000 mg | Freq: Once | INTRAMUSCULAR | Status: DC

## 2017-07-02 MED ORDER — PROPOFOL 500 MG/50ML IV EMUL
INTRAVENOUS | Status: AC
Start: 2017-07-02 — End: 2017-07-02
  Filled 2017-07-02: qty 50

## 2017-07-02 MED ORDER — PROPOFOL 500 MG/50ML IV EMUL
INTRAVENOUS | Status: DC | PRN
  Administered 2017-07-02: 150 ug/kg/min via INTRAVENOUS

## 2017-07-02 MED ORDER — CLINDAMYCIN PHOSPHATE 600 MG/50ML IV SOLN
600.0000 mg | Freq: Once | INTRAVENOUS | Status: AC
  Administered 2017-07-02: 600 mg via INTRAVENOUS

## 2017-07-02 MED ORDER — SODIUM CHLORIDE 0.9 % IV SOLN
INTRAVENOUS | Status: DC
  Administered 2017-07-02: 14:00:00 via INTRAVENOUS
  Administered 2017-07-02: 1000 mL via INTRAVENOUS
  Administered 2017-07-02: 13:00:00 via INTRAVENOUS

## 2017-07-02 MED ORDER — MIDAZOLAM HCL 2 MG/2ML IJ SOLN
INTRAMUSCULAR | Status: DC | PRN
  Administered 2017-07-02: 2 mg via INTRAVENOUS

## 2017-07-02 MED ORDER — LIDOCAINE HCL (PF) 2 % IJ SOLN
INTRAMUSCULAR | Status: AC
  Filled 2017-07-02: qty 10

## 2017-07-02 MED ORDER — CLINDAMYCIN PHOSPHATE 600 MG/50ML IV SOLN
INTRAVENOUS | Status: AC
  Administered 2017-07-02: 600 mg via INTRAVENOUS
  Filled 2017-07-02: qty 50

## 2017-07-02 MED ORDER — MIDAZOLAM HCL 2 MG/2ML IJ SOLN
INTRAMUSCULAR | Status: AC
  Filled 2017-07-02: qty 2

## 2017-07-02 MED ORDER — PROPOFOL 10 MG/ML IV BOLUS
INTRAVENOUS | Status: DC | PRN
  Administered 2017-07-02: 60 mg via INTRAVENOUS

## 2017-07-02 MED ORDER — PHENYLEPHRINE HCL 10 MG/ML IJ SOLN
INTRAMUSCULAR | Status: DC | PRN
  Administered 2017-07-02: 200 ug via INTRAVENOUS

## 2017-07-02 MED ORDER — LIDOCAINE HCL (CARDIAC) 20 MG/ML IV SOLN
INTRAVENOUS | Status: DC | PRN
  Administered 2017-07-02: 80 mg via INTRAVENOUS

## 2017-07-02 NOTE — Anesthesia Post-op Follow-up Note (Signed)
Anesthesia QCDR form completed.        

## 2017-07-02 NOTE — Transfer of Care (Signed)
Immediate Anesthesia Transfer of Care Note  Patient: Richard Davies  Procedure(s) Performed: COLONOSCOPY WITH PROPOFOL (N/A )  Patient Location: Endoscopy Unit  Anesthesia Type:General  Level of Consciousness: drowsy and patient cooperative  Airway & Oxygen Therapy: Patient Spontanous Breathing and Patient connected to nasal cannula oxygen  Post-op Assessment: Report given to RN, Post -op Vital signs reviewed and stable and Patient moving all extremities X 4  Post vital signs: Reviewed and stable  Last Vitals:  Vitals:   07/02/17 1247  BP: (!) 132/96  Pulse: 70  Resp: 18  Temp: 36.6 C  SpO2: 99%    Last Pain:  Vitals:   07/02/17 1247  TempSrc: Tympanic         Complications: No apparent anesthesia complications

## 2017-07-02 NOTE — H&P (Signed)
Outpatient short stay form Pre-procedure 07/02/2017 1:30 PM Christena DeemMartin U Skulskie MD  Primary Physician: Dr Marisue IvanKanhka Linthavong  Reason for visit:  Colonoscopy  History of present illness:  Patient is a 66 year old male presenting today as above. He has personal history of adenomatous colon polyp removed in November 2011. He tolerated his prep well. He takes no aspirin or blood thinning agents.    Current Facility-Administered Medications:  .  0.9 %  sodium chloride infusion, , Intravenous, Continuous, Christena DeemSkulskie, Martin U, MD, Last Rate: 20 mL/hr at 07/02/17 1304, 1,000 mL at 07/02/17 1304 .  0.9 %  sodium chloride infusion, , Intravenous, Continuous, Christena DeemSkulskie, Martin U, MD .  clindamycin (CLEOCIN) IVPB 600 mg, 600 mg, Intravenous, Once, Christena DeemSkulskie, Martin U, MD, Last Rate: 100 mL/hr at 07/02/17 1305, 600 mg at 07/02/17 1305  Medications Prior to Admission  Medication Sig Dispense Refill Last Dose  . calcium carbonate (OS-CAL) 1250 (500 Ca) MG chewable tablet Chew 1 tablet by mouth as needed for heartburn.   "2 or 3 days ago"  . enoxaparin (LOVENOX) 30 MG/0.3ML injection Inject 0.4 mLs (40 mg total) into the skin daily. (Patient not taking: Reported on 07/02/2017) 14 Syringe 0 Completed Course at Unknown time  . naproxen sodium (ALEVE) 220 MG tablet Take 220 mg daily as needed by mouth.   Not Taking at Unknown time  . oxyCODONE (OXY IR/ROXICODONE) 5 MG immediate release tablet Take 1-2 tablets (5-10 mg total) by mouth every 4 (four) hours as needed for severe pain or breakthrough pain. (Patient not taking: Reported on 07/02/2017) 30 tablet 0 Not Taking at Unknown time  . sildenafil (REVATIO) 20 MG tablet Take 20 mg by mouth 3 (three) times daily. 3-5 tablets per day as needed.     . traMADol (ULTRAM) 50 MG tablet Take 1-2 tablets (50-100 mg total) by mouth every 4 (four) hours as needed for moderate pain. (Patient not taking: Reported on 07/02/2017) 60 tablet 0 Not Taking at Unknown time      Allergies  Allergen Reactions  . Codeine Nausea Only  . Ciprofloxacin Other (See Comments)    Joint pain  . Penicillins Rash    Has patient had a PCN reaction causing immediate rash, facial/tongue/throat swelling, SOB or lightheadedness with hypotension: Yes Has patient had a PCN reaction causing severe rash involving mucus membranes or skin necrosis: No Has patient had a PCN reaction that required hospitalization No Has patient had a PCN reaction occurring within the last 10 years: No If all of the above answers are "NO", then may proceed with Cephalosporin use.      Past Medical History:  Diagnosis Date  . Arthritis   . Arthrosis of knee   . Arthrosis of knee   . 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 Environmental allergies   . Sleep apnea    NO CPAP    Review of systems:      Physical Exam    Heart and lungs: Regular rate and rhythm without rub or gallop, lungs are bilaterally clear.    HEENT: Normocephalic atraumatic eyes are anicteric    Other:     Pertinant exam for procedure: Soft nontender nondistended bowel sounds positive normoactive.    Planned proceedures: Colonoscopy and indicated procedures. I have discussed the risks benefits and complications of procedures to include not limited to bleeding, infection, perforation and the risk of sedation and the patient wishes to proceed.    Christena DeemMartin U Skulskie, MD Gastroenterology 07/02/2017  1:30 PM

## 2017-07-02 NOTE — Anesthesia Preprocedure Evaluation (Signed)
Anesthesia Evaluation  Patient identified by MRN, date of birth, ID band Patient awake    Reviewed: Allergy & Precautions, H&P , NPO status , Patient's Chart, lab work & pertinent test results, reviewed documented beta blocker date and time   Airway Mallampati: II   Neck ROM: full    Dental  (+) Teeth Intact   Pulmonary neg pulmonary ROS, sleep apnea ,    Pulmonary exam normal        Cardiovascular negative cardio ROS Normal cardiovascular exam Rhythm:regular Rate:Normal     Neuro/Psych negative neurological ROS  negative psych ROS   GI/Hepatic negative GI ROS, Neg liver ROS,   Endo/Other  negative endocrine ROS  Renal/GU negative Renal ROS  negative genitourinary   Musculoskeletal   Abdominal   Peds  Hematology negative hematology ROS (+)   Anesthesia Other Findings Past Medical History: No date: Arthritis No date: Arthrosis of knee No date: Arthrosis of knee No date: 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 No date: Environmental allergies No date: Sleep apnea     Comment:  NO CPAP Past Surgical History: 04/01/2017: COMPUTER ASSISTED TOTAL KNEE ARTHROPLASTY; Right     Comment:  Performed by Donato HeinzHooten, Maninder Deboer P, [facility] 2007: FOOT SURGERY     Comment:  R big toe shortened 1980, 2012: HERNIA REPAIR No date: INCISION AND DRAINAGE ABSCESS ANAL 2011: incision and drainage rectal abcess 2015: ROTATOR CUFF REPAIR; Right 04/05/2016: SHOULDER ARTHROSCOPY WITH OPEN ROTATOR CUFF REPAIR; Left     Comment:  Performed by Christena FlakePoggi, John J, MD [facility] 04/05/2016: SHOULDER ARTHROSCOPY WITH SUBACROMIAL DECOMPRESSION AND  LIMITED DEBRIDMENT; Left     Comment:  Performed by Christena FlakePoggi, John J, [facility]   Reproductive/Obstetrics negative OB ROS                             Anesthesia Physical Anesthesia Plan  ASA: III  Anesthesia Plan: General   Post-op Pain Management:     Induction:   PONV Risk Score and Plan:   Airway Management Planned:   Additional Equipment:   Intra-op Plan:   Post-operative Plan:   Informed Consent: I have reviewed the patients History and Physical, chart, labs and discussed the procedure including the risks, benefits and alternatives for the proposed anesthesia with the patient or authorized representative who has indicated his/her understanding and acceptance.   Dental Advisory Given  Plan Discussed with: CRNA  Anesthesia Plan Comments:         Anesthesia Quick Evaluation

## 2017-07-02 NOTE — Op Note (Signed)
[facility] Gastroenterology Patient Name: Richard Davies Procedure Date: 07/02/2017 11:55 AM MRN: [id number] Account #: [id number] Date of Birth: 01/25/1951 Admit Type: Outpatient Age: [age] [facility] Gender: Male Note Status: Finalized Procedure:            Colonoscopy Indications:          Personal history of colonic polyps Providers:            Christena DeemMartin U. Demarri Elie, MD Referring MD:         Marisue IvanKanhka Linthavong (Referring MD) Medicines:            Monitored Anesthesia Care Complications:        No immediate complications. Procedure:            Pre-Anesthesia Assessment:                       - ASA Grade Assessment: III - A patient with severe                        systemic disease.                       After obtaining informed consent, the colonoscope was                        passed under direct vision. Throughout the procedure,                        the patient's blood pressure, pulse, and oxygen                        saturations were monitored continuously. The Olympus                        PCF-H180AL colonoscope ( S#: [id number] ) was introduced                        through the anus and advanced to the the cecum,                        identified by appendiceal orifice and ileocecal valve.                        The colonoscopy was performed without difficulty. The                        patient tolerated the procedure well. The quality of                        the bowel preparation was good. Findings:      A few medium-mouthed diverticula were found in the sigmoid colon and       distal descending colon.      The digital rectal exam was normal.      The retroflexed view of the distal rectum and anal verge was normal and       showed no anal or rectal abnormalities. Note anal pillar.      The exam was otherwise without abnormality. Impression:           - Diverticulosis in the sigmoid colon and in the distal  descending  colon.                       - The distal rectum and anal verge are normal on                        retroflexion view.                       - The examination was otherwise normal.                       - No specimens collected. Recommendation:       - Discharge patient to home.                       - Advance diet as tolerated. Procedure Code(s):    --- Professional ---                       [phone], Colonoscopy, flexible; diagnostic, including                        collection of specimen(s) by brushing or washing, when                        performed (separate procedure) Diagnosis Code(s):    --- Professional ---                       Z86.010, Personal history of colonic polyps                       K57.30, Diverticulosis of large intestine without                        perforation or abscess without bleeding CPT copyright 2016 American [employer] Association. All rights reserved. The codes documented in this report are preliminary and upon coder review may  be revised to meet current compliance requirements. Christena Deem, MD 07/02/2017 1:57:37 PM This report has been signed electronically. Number of Addenda: 0 Note Initiated On: 07/02/2017 11:55 AM Scope Withdrawal Time: 0 hours 6 minutes 16 seconds  Total Procedure Duration: 0 hours 17 minutes 14 seconds       [facility]

## 2017-07-03 ENCOUNTER — Encounter: Payer: Self-pay | Admitting: Gastroenterology

## 2017-07-03 NOTE — Anesthesia Postprocedure Evaluation (Signed)
Anesthesia Post Note  Patient: Richard Davies  Procedure(s) Performed: COLONOSCOPY WITH PROPOFOL (N/A )  Patient location during evaluation: PACU Anesthesia Type: General Level of consciousness: awake and alert Pain management: pain level controlled Vital Signs Assessment: post-procedure vital signs reviewed and stable Respiratory status: spontaneous breathing, nonlabored ventilation, respiratory function stable and patient connected to nasal cannula oxygen Cardiovascular status: blood pressure returned to baseline and stable Postop Assessment: no apparent nausea or vomiting Anesthetic complications: no     Last Vitals:  Vitals:   07/02/17 1439 07/02/17 1449  BP: (!) 151/78 134/89  Pulse: (!) 58 (!) 113  Resp: 18 16  Temp:    SpO2: 98% 100%    Last Pain:  Vitals:   07/03/17 0740  TempSrc:   PainSc: 0-No pain                 Yevette EdwardsJames G Adams

## 2018-04-08 DIAGNOSIS — M1712 Unilateral primary osteoarthritis, left knee: Secondary | ICD-10-CM | POA: Insufficient documentation

## 2019-05-17 NOTE — Discharge Instructions (Signed)
°  Instructions after Total Knee Replacement ° ° Maryjean Corpening P. Santiel Topper, Jr., M.D.    ° Dept. of Orthopaedics & Sports Medicine ° [facility] ° [address] ° [place], [place]  [address] ° Phone: [phone]   Fax: [phone] ° °  °DIET: °• Drink plenty of non-alcoholic fluids. °• Resume your normal diet. Include foods high in fiber. ° °ACTIVITY:  °• You may use crutches or a walker with weight-bearing as tolerated, unless instructed otherwise. °• You may be weaned off of the walker or crutches by your Physical Therapist.  °• Do NOT place pillows under the knee. Anything placed under the knee could limit your ability to straighten the knee.   °• Continue doing gentle exercises. Exercising will reduce the pain and swelling, increase motion, and prevent muscle weakness.   °• Please continue to use the TED compression stockings for 6 weeks. You may remove the stockings at night, but should reapply them in the morning. °• Do not drive or operate any equipment until instructed. ° °WOUND CARE:  °• Continue to use the PolarCare or ice packs periodically to reduce pain and swelling. °• You may bathe or shower after the staples are removed at the first office visit following surgery. ° °MEDICATIONS: °• You may resume your regular medications. °• Please take the pain medication as prescribed on the medication. °• Do not take pain medication on an empty stomach. °• You have been given a prescription for a blood thinner (Lovenox or Coumadin). Please take the medication as instructed. (NOTE: After completing a 2 week course of Lovenox, take one Enteric-coated aspirin once a day. This along with elevation will help reduce the possibility of phlebitis in your operated leg.) °• Do not drive or drink alcoholic beverages when taking pain medications. ° °CALL THE OFFICE FOR: °• Temperature above 101 degrees °• Excessive bleeding or drainage on the dressing. °• Excessive swelling, coldness, or paleness of the toes. °• Persistent  nausea and vomiting. ° °FOLLOW-UP:  °• You should have an appointment to return to the office in 10-14 days after surgery. °• Arrangements have been made for continuation of Physical Therapy (either home therapy or outpatient therapy). °  °

## 2019-05-25 ENCOUNTER — Encounter
Admission: RE | Admit: 2019-05-25 | Discharge: 2019-05-25 | Disposition: A | Discharge status: Home / Self Care | Payer: Medicare HMO | Source: Ambulatory Visit | Attending: Orthopedic Surgery | Admitting: Orthopedic Surgery

## 2019-05-25 ENCOUNTER — Other Ambulatory Visit: Payer: Self-pay

## 2019-05-25 DIAGNOSIS — Z01818 Encounter for other preprocedural examination: Secondary | ICD-10-CM | POA: Insufficient documentation

## 2019-05-25 LAB — COMPREHENSIVE METABOLIC PANEL
ALT: 27 U/L (ref 0–44)
AST: 22 U/L (ref 15–41)
Albumin: 4.3 g/dL (ref 3.5–5.0)
Alkaline Phosphatase: 51 U/L (ref 38–126)
Anion gap: 6 (ref 5–15)
BUN: 19 mg/dL (ref 8–23)
CO2: 25 mmol/L (ref 22–32)
Calcium: 8.9 mg/dL (ref 8.9–10.3)
Chloride: 105 mmol/L (ref 98–111)
Creatinine, Ser: 0.92 mg/dL (ref 0.61–1.24)
GFR calc Af Amer: >60 mL/min (ref >60)
GFR calc non Af Amer: >60 mL/min (ref >60)
Glucose, Bld: 105 mg/dL — ABNORMAL HIGH (ref 70–99)
Potassium: 4.2 mmol/L (ref 3.5–5.1)
Sodium: 136 mmol/L (ref 135–145)
Total Bilirubin: 1.5 mg/dL — ABNORMAL HIGH (ref 0.3–1.2)
Total Protein: 6.8 g/dL (ref 6.5–8.1)

## 2019-05-25 LAB — PROTIME-INR
INR: 0.9 (ref 0.8–1.2)
Prothrombin Time: 12.4 seconds (ref 11.4–15.2)

## 2019-05-25 LAB — SURGICAL PCR SCREEN
MRSA, PCR: NEGATIVE
Staphylococcus aureus: NEGATIVE

## 2019-05-25 LAB — URINALYSIS, ROUTINE W REFLEX MICROSCOPIC
Bilirubin Urine: NEGATIVE
Glucose, UA: NEGATIVE mg/dL
Hgb urine dipstick: NEGATIVE
Ketones, ur: NEGATIVE mg/dL
Leukocytes,Ua: NEGATIVE
Nitrite: NEGATIVE
Protein, ur: NEGATIVE mg/dL
Specific Gravity, Urine: 1.016 (ref 1.005–1.030)
pH: 6 (ref 5.0–8.0)

## 2019-05-25 LAB — CBC
HCT: 44.9 % (ref 39.0–52.0)
Hemoglobin: 15.5 g/dL (ref 13.0–17.0)
MCH: 31.4 pg (ref 26.0–34.0)
MCHC: 34.5 g/dL (ref 30.0–36.0)
MCV: 90.9 fL (ref 80.0–100.0)
Platelets: 293 10*3/uL (ref 150–400)
RBC: 4.94 MIL/uL (ref 4.22–5.81)
RDW: 12.5 % (ref 11.5–15.5)
WBC: 8.3 10*3/uL (ref 4.0–10.5)
nRBC: 0 % (ref 0.0–0.2)

## 2019-05-25 LAB — TYPE AND SCREEN
ABO/RH(D): A POS
Antibody Screen: NEGATIVE

## 2019-05-25 LAB — C-REACTIVE PROTEIN: CRP: <0.8 mg/dL (ref <1.0)

## 2019-05-25 LAB — SEDIMENTATION RATE: Sed Rate: 2 mm/hr (ref 0–20)

## 2019-05-25 LAB — APTT: aPTT: 35 seconds (ref 24–36)

## 2019-05-25 NOTE — Patient Instructions (Signed)
Your procedure is scheduled on: Mon 10/19 Report to Day Surgery. To find out your arrival time please call [phone] between 1PM - 3PM on Fri.  10/16.  Remember: Instructions that are not followed completely may result in serious medical risk,  up to and including death, or upon the discretion of your surgeon and anesthesiologist your  surgery may need to be rescheduled.     _X__ 1. Do not eat food after midnight the night before your procedure.                 No gum chewing or hard candies. You may drink clear liquids up to 2 hours                 before you are scheduled to arrive for your surgery- DO not drink clear                 liquids within 2 hours of the start of your surgery.                 Clear Liquids include:  water, apple juice without pulp, clear carbohydrate                 drink such as Clearfast of Gatorade, Black Coffee or Tea (Do not add                 anything to coffee or tea).  __X__2.  On the morning of surgery brush your teeth with toothpaste and water, you                may rinse your mouth with mouthwash if you wish.  Do not swallow any toothpaste of mouthwash.     _X__ 3.  No Alcohol for 24 hours before or after surgery.   __ 4.  Do Not Smoke or use e-cigarettes For 24 Hours Prior to Your Surgery.                 Do not use any chewable tobacco products for at least 6 hours prior to                 surgery.  ____  5.  Bring all medications with you on the day of surgery if instructed.   __x__  6.  Notify your doctor if there is any change in your medical condition      (cold, fever, infections).     Do not wear jewelry, make-up, hairpins, clips or nail polish. Do not wear lotions, powders, or perfumes. You may wear deodorant. Do not shave 48 hours prior to surgery. Men may shave face and neck. Do not bring valuables to the hospital.    [facility] is not responsible for any belongings or valuables.  Contacts, dentures  or bridgework may not be worn into surgery. Leave your suitcase in the car. After surgery it may be brought to your room. For patients admitted to the hospital, discharge time is determined by your treatment team.   Patients discharged the day of surgery will not be allowed to drive home.   Please read over the following fact sheets that you were given:    ____ Take these medicines the morning of surgery with A SIP OF WATER:    1. none  2.   3.   4.  5.  6.  ____ Fleet Enema (as directed)   __x__ Use CHG Soap as directed  ____ Use inhalers on the day  of surgery  ____ Stop metformin 2 days prior to surgery    ____ Take 1/2 of usual insulin dose the night before surgery. No insulin the morning          of surgery.   ____ Stop Coumadin/Plavix/aspirin on  __x__ Stop Anti-inflammatories ibuprofen, aleve or aspirin.  May take tylenol   ____ Stop supplements until after surgery.    ____ Bring C-Pap to the hospital.

## 2019-05-26 LAB — URINE CULTURE
Culture: NO GROWTH
Special Requests: NORMAL

## 2019-05-27 LAB — IGE: IgE (Immunoglobulin E), Serum: 268 IU/mL (ref 6–495)

## 2019-05-28 ENCOUNTER — Other Ambulatory Visit
Admission: RE | Admit: 2019-05-28 | Discharge: 2019-05-28 | Disposition: A | Discharge status: Home / Self Care | Payer: Medicare HMO | Source: Ambulatory Visit | Attending: Orthopedic Surgery | Admitting: Orthopedic Surgery

## 2019-05-28 ENCOUNTER — Other Ambulatory Visit: Payer: Self-pay

## 2019-05-28 DIAGNOSIS — Z01812 Encounter for preprocedural laboratory examination: Secondary | ICD-10-CM | POA: Diagnosis present

## 2019-05-28 DIAGNOSIS — Z20828 Contact with and (suspected) exposure to other viral communicable diseases: Secondary | ICD-10-CM | POA: Insufficient documentation

## 2019-05-28 LAB — SARS CORONAVIRUS 2 (TAT 6-24 HRS): SARS Coronavirus 2: NEGATIVE

## 2019-05-31 MED ORDER — CLINDAMYCIN PHOSPHATE 900 MG/50ML IV SOLN
900.0000 mg | INTRAVENOUS | Status: AC
  Administered 2019-06-01: 12:00:00 900 mg via INTRAVENOUS

## 2019-05-31 MED ORDER — TRANEXAMIC ACID-NACL 1000-0.7 MG/100ML-% IV SOLN
1000.0000 mg | INTRAVENOUS | Status: AC
  Administered 2019-06-01: 1000 mg via INTRAVENOUS
  Filled 2019-05-31: qty 100

## 2019-06-01 ENCOUNTER — Inpatient Hospital Stay: Payer: Medicare HMO | Admitting: Anesthesiology

## 2019-06-01 ENCOUNTER — Inpatient Hospital Stay
Admission: RE | Admit: 2019-06-01 | Discharge: 2019-06-03 | DRG: 470 | Disposition: A | Discharge status: Home Health | Payer: Medicare HMO | Attending: Orthopedic Surgery | Admitting: Orthopedic Surgery

## 2019-06-01 ENCOUNTER — Encounter: Payer: Self-pay | Admitting: Orthopedic Surgery

## 2019-06-01 ENCOUNTER — Other Ambulatory Visit: Payer: Self-pay

## 2019-06-01 ENCOUNTER — Encounter
Admission: RE | Disposition: A | Discharge status: Home Health | Payer: Self-pay | Source: Home / Self Care | Attending: Orthopedic Surgery

## 2019-06-01 ENCOUNTER — Inpatient Hospital Stay: Payer: Medicare HMO

## 2019-06-01 DIAGNOSIS — G4730 Sleep apnea, unspecified: Secondary | ICD-10-CM | POA: Diagnosis present

## 2019-06-01 DIAGNOSIS — M1712 Unilateral primary osteoarthritis, left knee: Principal | ICD-10-CM | POA: Diagnosis present

## 2019-06-01 DIAGNOSIS — Z87891 Personal history of nicotine dependence: Secondary | ICD-10-CM

## 2019-06-01 DIAGNOSIS — Z96659 Presence of unspecified artificial knee joint: Secondary | ICD-10-CM

## 2019-06-01 HISTORY — PX: KNEE ARTHROPLASTY: SHX992

## 2019-06-01 SURGERY — ARTHROPLASTY, KNEE, TOTAL, USING IMAGELESS COMPUTER-ASSISTED NAVIGATION
Anesthesia: Choice | Site: Knee | Laterality: Left

## 2019-06-01 MED ORDER — NEOMYCIN-POLYMYXIN B GU 40-200000 IR SOLN
Status: DC | PRN
  Administered 2019-06-01: 14 mL

## 2019-06-01 MED ORDER — FENTANYL CITRATE (PF) 100 MCG/2ML IJ SOLN
INTRAMUSCULAR | Status: AC
  Filled 2019-06-01: qty 2

## 2019-06-01 MED ORDER — TRANEXAMIC ACID-NACL 1000-0.7 MG/100ML-% IV SOLN
1000.0000 mg | Freq: Once | INTRAVENOUS | Status: AC
  Administered 2019-06-01: 1000 mg via INTRAVENOUS
  Filled 2019-06-01: qty 100

## 2019-06-01 MED ORDER — HYDROMORPHONE HCL 1 MG/ML IJ SOLN: 0.5000 mg | INTRAMUSCULAR | Status: DC | PRN

## 2019-06-01 MED ORDER — OXYCODONE HCL 5 MG PO TABS: 5.0000 mg | ORAL_TABLET | ORAL | Status: DC | PRN

## 2019-06-01 MED ORDER — CELECOXIB 200 MG PO CAPS
ORAL_CAPSULE | ORAL | Status: AC
  Administered 2019-06-01: 400 mg via ORAL
  Filled 2019-06-01: qty 2

## 2019-06-01 MED ORDER — SODIUM CHLORIDE 0.9 % IV SOLN
INTRAVENOUS | Status: DC | PRN
  Administered 2019-06-01: 30 ug/min via INTRAVENOUS

## 2019-06-01 MED ORDER — FAMOTIDINE 20 MG PO TABS
20.0000 mg | ORAL_TABLET | Freq: Once | ORAL | Status: AC
  Administered 2019-06-01: 10:00:00 20 mg via ORAL

## 2019-06-01 MED ORDER — MENTHOL 3 MG MT LOZG
1.0000 | LOZENGE | OROMUCOSAL | Status: DC | PRN
  Filled 2019-06-01: qty 9

## 2019-06-01 MED ORDER — FENTANYL CITRATE (PF) 100 MCG/2ML IJ SOLN: 25.0000 ug | INTRAMUSCULAR | Status: DC | PRN

## 2019-06-01 MED ORDER — SODIUM CHLORIDE 0.9 % IV SOLN
INTRAVENOUS | Status: DC | PRN
  Administered 2019-06-01: 60 mL

## 2019-06-01 MED ORDER — LIDOCAINE HCL (CARDIAC) PF 100 MG/5ML IV SOSY
PREFILLED_SYRINGE | INTRAVENOUS | Status: DC | PRN
  Administered 2019-06-01: 140 mg via INTRAVENOUS

## 2019-06-01 MED ORDER — ACETAMINOPHEN 325 MG PO TABS: 325.0000 mg | ORAL_TABLET | Freq: Four times a day (QID) | ORAL | Status: DC | PRN

## 2019-06-01 MED ORDER — PROPOFOL 500 MG/50ML IV EMUL
INTRAVENOUS | Status: AC
  Filled 2019-06-01: qty 50

## 2019-06-01 MED ORDER — CEFAZOLIN SODIUM-DEXTROSE 2-4 GM/100ML-% IV SOLN
2.0000 g | Freq: Four times a day (QID) | INTRAVENOUS | Status: AC
  Administered 2019-06-01 – 2019-06-02 (×4): 2 g via INTRAVENOUS
  Filled 2019-06-01 (×4): qty 100

## 2019-06-01 MED ORDER — ADULT MULTIVITAMIN W/MINERALS CH
1.0000 | ORAL_TABLET | ORAL | Status: DC
  Administered 2019-06-01: 1 via ORAL
  Filled 2019-06-01: qty 1

## 2019-06-01 MED ORDER — TETRACAINE HCL 1 % IJ SOLN
INTRAMUSCULAR | Status: DC | PRN
  Administered 2019-06-01: 5 mg via INTRASPINAL

## 2019-06-01 MED ORDER — OXYCODONE HCL 5 MG PO TABS: 10.0000 mg | ORAL_TABLET | ORAL | Status: DC | PRN

## 2019-06-01 MED ORDER — FENTANYL CITRATE (PF) 100 MCG/2ML IJ SOLN
INTRAMUSCULAR | Status: DC | PRN
  Administered 2019-06-01: 50 ug via INTRAVENOUS

## 2019-06-01 MED ORDER — CELECOXIB 200 MG PO CAPS
400.0000 mg | ORAL_CAPSULE | Freq: Once | ORAL | Status: AC
  Administered 2019-06-01: 10:00:00 400 mg via ORAL

## 2019-06-01 MED ORDER — PHENOL 1.4 % MT LIQD
1.0000 | OROMUCOSAL | Status: DC | PRN
  Filled 2019-06-01: qty 177

## 2019-06-01 MED ORDER — CHLORHEXIDINE GLUCONATE 4 % EX LIQD
60.0000 mL | Freq: Once | CUTANEOUS | Status: AC
  Administered 2019-06-01: 4 via TOPICAL

## 2019-06-01 MED ORDER — TRAMADOL HCL 50 MG PO TABS
50.0000 mg | ORAL_TABLET | ORAL | Status: DC | PRN
  Administered 2019-06-02: 50 mg via ORAL
  Administered 2019-06-02: 09:00:00 100 mg via ORAL
  Filled 2019-06-01: qty 2
  Filled 2019-06-01 (×2): qty 1

## 2019-06-01 MED ORDER — MAGNESIUM HYDROXIDE 400 MG/5ML PO SUSP
30.0000 mL | Freq: Every day | ORAL | Status: DC
  Administered 2019-06-02: 30 mL via ORAL
  Filled 2019-06-01 (×2): qty 30

## 2019-06-01 MED ORDER — METOCLOPRAMIDE HCL 5 MG/ML IJ SOLN: 5.0000 mg | Freq: Three times a day (TID) | INTRAMUSCULAR | Status: DC | PRN

## 2019-06-01 MED ORDER — ALUM & MAG HYDROXIDE-SIMETH 200-200-20 MG/5ML PO SUSP: 30.0000 mL | ORAL | Status: DC | PRN

## 2019-06-01 MED ORDER — BUPIVACAINE HCL (PF) 0.5 % IJ SOLN
INTRAMUSCULAR | Status: AC
  Filled 2019-06-01: qty 10

## 2019-06-01 MED ORDER — CLINDAMYCIN PHOSPHATE 900 MG/50ML IV SOLN
INTRAVENOUS | Status: AC
  Filled 2019-06-01: qty 50

## 2019-06-01 MED ORDER — DIPHENHYDRAMINE HCL 12.5 MG/5ML PO ELIX: 12.5000 mg | ORAL_SOLUTION | ORAL | Status: DC | PRN

## 2019-06-01 MED ORDER — OXYCODONE HCL 5 MG/5ML PO SOLN: 5.0000 mg | Freq: Once | ORAL | Status: DC | PRN

## 2019-06-01 MED ORDER — SENNOSIDES-DOCUSATE SODIUM 8.6-50 MG PO TABS
1.0000 | ORAL_TABLET | Freq: Two times a day (BID) | ORAL | Status: DC
  Administered 2019-06-01 – 2019-06-02 (×3): 1 via ORAL
  Filled 2019-06-01 (×3): qty 1

## 2019-06-01 MED ORDER — GABAPENTIN 300 MG PO CAPS
ORAL_CAPSULE | ORAL | Status: AC
  Administered 2019-06-01: 300 mg via ORAL
  Filled 2019-06-01: qty 1

## 2019-06-01 MED ORDER — SODIUM CHLORIDE 0.9 % IV SOLN
INTRAVENOUS | Status: DC
  Administered 2019-06-01 – 2019-06-02 (×2): via INTRAVENOUS

## 2019-06-01 MED ORDER — FERROUS SULFATE 325 (65 FE) MG PO TABS
325.0000 mg | ORAL_TABLET | Freq: Two times a day (BID) | ORAL | Status: DC
  Administered 2019-06-01 – 2019-06-03 (×4): 325 mg via ORAL
  Filled 2019-06-01 (×4): qty 1

## 2019-06-01 MED ORDER — DEXAMETHASONE SODIUM PHOSPHATE 10 MG/ML IJ SOLN
INTRAMUSCULAR | Status: AC
  Administered 2019-06-01: 8 mg via INTRAVENOUS
  Filled 2019-06-01: qty 1

## 2019-06-01 MED ORDER — BISACODYL 10 MG RE SUPP: 10.0000 mg | Freq: Every day | RECTAL | Status: DC | PRN

## 2019-06-01 MED ORDER — ONDANSETRON HCL 4 MG/2ML IJ SOLN: 4.0000 mg | Freq: Four times a day (QID) | INTRAMUSCULAR | Status: DC | PRN

## 2019-06-01 MED ORDER — CELECOXIB 200 MG PO CAPS
200.0000 mg | ORAL_CAPSULE | Freq: Two times a day (BID) | ORAL | Status: DC
  Administered 2019-06-01 – 2019-06-03 (×4): 200 mg via ORAL
  Filled 2019-06-01 (×4): qty 1

## 2019-06-01 MED ORDER — ENSURE PRE-SURGERY PO LIQD
296.0000 mL | Freq: Once | ORAL | Status: AC
  Administered 2019-06-01: 296 mL via ORAL
  Filled 2019-06-01: qty 296

## 2019-06-01 MED ORDER — METOCLOPRAMIDE HCL 10 MG PO TABS
10.0000 mg | ORAL_TABLET | Freq: Three times a day (TID) | ORAL | Status: DC
  Administered 2019-06-01 – 2019-06-03 (×6): 10 mg via ORAL
  Filled 2019-06-01 (×7): qty 1

## 2019-06-01 MED ORDER — ACETAMINOPHEN 10 MG/ML IV SOLN
INTRAVENOUS | Status: AC
  Filled 2019-06-01: qty 100

## 2019-06-01 MED ORDER — MIDAZOLAM HCL 2 MG/2ML IJ SOLN
INTRAMUSCULAR | Status: AC
  Filled 2019-06-01: qty 2

## 2019-06-01 MED ORDER — PROPOFOL 10 MG/ML IV BOLUS
INTRAVENOUS | Status: DC | PRN
  Administered 2019-06-01 (×4): 16 mg via INTRAVENOUS

## 2019-06-01 MED ORDER — LIDOCAINE HCL (PF) 2 % IJ SOLN
INTRAMUSCULAR | Status: AC
  Filled 2019-06-01: qty 10

## 2019-06-01 MED ORDER — GLYCOPYRROLATE 0.2 MG/ML IJ SOLN
INTRAMUSCULAR | Status: DC | PRN
  Administered 2019-06-01: 0.2 mg via INTRAVENOUS

## 2019-06-01 MED ORDER — FAMOTIDINE 20 MG PO TABS
ORAL_TABLET | ORAL | Status: AC
  Administered 2019-06-01: 20 mg via ORAL
  Filled 2019-06-01: qty 1

## 2019-06-01 MED ORDER — DEXAMETHASONE SODIUM PHOSPHATE 10 MG/ML IJ SOLN
8.0000 mg | Freq: Once | INTRAMUSCULAR | Status: AC
  Administered 2019-06-01: 10:00:00 8 mg via INTRAVENOUS

## 2019-06-01 MED ORDER — OXYCODONE HCL 5 MG PO TABS: 5.0000 mg | ORAL_TABLET | Freq: Once | ORAL | Status: DC | PRN

## 2019-06-01 MED ORDER — ONDANSETRON HCL 4 MG PO TABS: 4.0000 mg | ORAL_TABLET | Freq: Four times a day (QID) | ORAL | Status: DC | PRN

## 2019-06-01 MED ORDER — LACTATED RINGERS IV SOLN
INTRAVENOUS | Status: DC
  Administered 2019-06-01 (×2): via INTRAVENOUS

## 2019-06-01 MED ORDER — ACETAMINOPHEN 10 MG/ML IV SOLN
INTRAVENOUS | Status: DC | PRN
  Administered 2019-06-01: 1000 mg via INTRAVENOUS

## 2019-06-01 MED ORDER — FLEET ENEMA 7-19 GM/118ML RE ENEM: 1.0000 | ENEMA | Freq: Once | RECTAL | Status: DC | PRN

## 2019-06-01 MED ORDER — PROPOFOL 500 MG/50ML IV EMUL
INTRAVENOUS | Status: DC | PRN
  Administered 2019-06-01: 55 ug/kg/min via INTRAVENOUS

## 2019-06-01 MED ORDER — BUPIVACAINE HCL (PF) 0.25 % IJ SOLN
INTRAMUSCULAR | Status: DC | PRN
  Administered 2019-06-01: 60 mL

## 2019-06-01 MED ORDER — GABAPENTIN 300 MG PO CAPS
300.0000 mg | ORAL_CAPSULE | Freq: Every day | ORAL | Status: DC
  Administered 2019-06-01 – 2019-06-02 (×2): 300 mg via ORAL
  Filled 2019-06-01 (×2): qty 1

## 2019-06-01 MED ORDER — METOCLOPRAMIDE HCL 10 MG PO TABS: 5.0000 mg | ORAL_TABLET | Freq: Three times a day (TID) | ORAL | Status: DC | PRN

## 2019-06-01 MED ORDER — MIDAZOLAM HCL 5 MG/5ML IJ SOLN
INTRAMUSCULAR | Status: DC | PRN
  Administered 2019-06-01: 2 mg via INTRAVENOUS

## 2019-06-01 MED ORDER — ENOXAPARIN SODIUM 30 MG/0.3ML ~~LOC~~ SOLN
30.0000 mg | Freq: Two times a day (BID) | SUBCUTANEOUS | Status: DC
  Administered 2019-06-02 – 2019-06-03 (×3): 30 mg via SUBCUTANEOUS
  Filled 2019-06-01 (×3): qty 0.3

## 2019-06-01 MED ORDER — PANTOPRAZOLE SODIUM 40 MG PO TBEC
40.0000 mg | DELAYED_RELEASE_TABLET | Freq: Two times a day (BID) | ORAL | Status: DC
  Administered 2019-06-01 – 2019-06-03 (×4): 40 mg via ORAL
  Filled 2019-06-01 (×4): qty 1

## 2019-06-01 MED ORDER — GABAPENTIN 300 MG PO CAPS
300.0000 mg | ORAL_CAPSULE | Freq: Once | ORAL | Status: AC
  Administered 2019-06-01: 10:00:00 300 mg via ORAL

## 2019-06-01 MED ORDER — ACETAMINOPHEN 10 MG/ML IV SOLN
1000.0000 mg | Freq: Four times a day (QID) | INTRAVENOUS | Status: AC
  Administered 2019-06-01 – 2019-06-02 (×4): 1000 mg via INTRAVENOUS
  Filled 2019-06-01 (×4): qty 100

## 2019-06-01 MED ORDER — BUPIVACAINE HCL (PF) 0.5 % IJ SOLN
INTRAMUSCULAR | Status: DC | PRN
  Administered 2019-06-01: 2.5 mL

## 2019-06-01 SURGICAL SUPPLY — 73 items
ATTUNE MED DOME PAT 38 KNEE (Knees) ×2 IMPLANT
ATTUNE MED DOME PAT 38MM KNEE (Knees) ×1 IMPLANT
ATTUNE PS FEM LT SZ 6 CEM KNEE (Femur) ×3 IMPLANT
ATTUNE PSRP INSR SZ6 5 KNEE (Insert) ×2 IMPLANT
ATTUNE PSRP INSR SZ6 5MM KNEE (Insert) ×1 IMPLANT
BASE TIBIAL ROT PLAT SZ 7 KNEE (Knees) ×1 IMPLANT
BATTERY INSTRU NAVIGATION (MISCELLANEOUS) ×12 IMPLANT
BLADE SAW 70X12.5 (BLADE) ×3 IMPLANT
BLADE SAW 90X13X1.19 OSCILLAT (BLADE) ×3 IMPLANT
BLADE SAW 90X25X1.19 OSCILLAT (BLADE) ×3 IMPLANT
CANISTER SUCT 3000ML PPV (MISCELLANEOUS) ×3 IMPLANT
CEMENT HV SMART SET (Cement) ×6 IMPLANT
COOLER POLAR GLACIER W/PUMP (MISCELLANEOUS) ×3 IMPLANT
COVER WAND RF STERILE (DRAPES) ×3 IMPLANT
CUFF TOURN SGL QUICK 24 (TOURNIQUET CUFF)
CUFF TOURN SGL QUICK 30 (TOURNIQUET CUFF) ×2
CUFF TRNQT CYL 24X4X16.5-23 (TOURNIQUET CUFF) IMPLANT
CUFF TRNQT CYL 30X4X21-28X (TOURNIQUET CUFF) ×1 IMPLANT
DRAPE 3/4 80X56 (DRAPES) ×3 IMPLANT
DRSG DERMACEA 8X12 NADH (GAUZE/BANDAGES/DRESSINGS) ×3 IMPLANT
DRSG OPSITE POSTOP 4X14 (GAUZE/BANDAGES/DRESSINGS) ×3 IMPLANT
DRSG TEGADERM 4X4.75 (GAUZE/BANDAGES/DRESSINGS) ×3 IMPLANT
DURAPREP 26ML APPLICATOR (WOUND CARE) ×6 IMPLANT
ELECT REM PT RETURN 9FT ADLT (ELECTROSURGICAL) ×3
ELECTRODE REM PT RTRN 9FT ADLT (ELECTROSURGICAL) ×1 IMPLANT
EX-PIN ORTHOLOCK NAV 4X150 (PIN) ×6 IMPLANT
GLOVE BIO SURGEON STRL SZ7.5 (GLOVE) ×6 IMPLANT
GLOVE BIOGEL M STRL SZ7.5 (GLOVE) ×6 IMPLANT
GLOVE BIOGEL PI IND STRL 7.5 (GLOVE) ×1 IMPLANT
GLOVE BIOGEL PI INDICATOR 7.5 (GLOVE) ×2
GLOVE INDICATOR 8.0 STRL GRN (GLOVE) ×3 IMPLANT
GOWN STRL REUS W/ TWL LRG LVL3 (GOWN DISPOSABLE) ×2 IMPLANT
GOWN STRL REUS W/ TWL XL LVL3 (GOWN DISPOSABLE) ×1 IMPLANT
GOWN STRL REUS W/TWL LRG LVL3 (GOWN DISPOSABLE) ×4
GOWN STRL REUS W/TWL XL LVL3 (GOWN DISPOSABLE) ×2
HEMOVAC 400CC 10FR (MISCELLANEOUS) ×3 IMPLANT
HOLDER FOLEY CATH W/STRAP (MISCELLANEOUS) ×3 IMPLANT
HOOD PEEL AWAY FLYTE STAYCOOL (MISCELLANEOUS) ×6 IMPLANT
KIT TURNOVER KIT A (KITS) ×3 IMPLANT
KNIFE SCULPS 14X20 (INSTRUMENTS) ×3 IMPLANT
LABEL OR SOLS (LABEL) ×3 IMPLANT
MANIFOLD NEPTUNE II (INSTRUMENTS) ×3 IMPLANT
NDL SAFETY ECLIPSE 18X1.5 (NEEDLE) ×1 IMPLANT
NEEDLE HYPO 18GX1.5 SHARP (NEEDLE) ×2
NEEDLE SPNL 20GX3.5 QUINCKE YW (NEEDLE) ×6 IMPLANT
NS IRRIG 500ML POUR BTL (IV SOLUTION) ×3 IMPLANT
PACK TOTAL KNEE (MISCELLANEOUS) ×3 IMPLANT
PAD WRAPON POLAR KNEE (MISCELLANEOUS) ×1 IMPLANT
PENCIL SMOKE ULTRAEVAC 22 CON (MISCELLANEOUS) ×3 IMPLANT
PIN DRILL QUICK PACK ×3 IMPLANT
PIN FIXATION 1/8DIA X 3INL (PIN) ×9 IMPLANT
PULSAVAC PLUS IRRIG FAN TIP (DISPOSABLE) ×3
SOL .9 NS 3000ML IRR  AL (IV SOLUTION) ×2
SOL .9 NS 3000ML IRR UROMATIC (IV SOLUTION) ×1 IMPLANT
SOL PREP PVP 2OZ (MISCELLANEOUS) ×3
SOLUTION PREP PVP 2OZ (MISCELLANEOUS) ×1 IMPLANT
SPONGE DRAIN TRACH 4X4 STRL 2S (GAUZE/BANDAGES/DRESSINGS) ×3 IMPLANT
STAPLER SKIN PROX 35W (STAPLE) ×3 IMPLANT
STOCKINETTE IMPERV 14X48 (MISCELLANEOUS) IMPLANT
STRAP TIBIA SHORT (MISCELLANEOUS) ×3 IMPLANT
SUCTION FRAZIER HANDLE 10FR (MISCELLANEOUS) ×2
SUCTION TUBE FRAZIER 10FR DISP (MISCELLANEOUS) ×1 IMPLANT
SUT VIC AB 0 CT1 36 (SUTURE) ×6 IMPLANT
SUT VIC AB 1 CT1 36 (SUTURE) ×6 IMPLANT
SUT VIC AB 2-0 CT2 27 (SUTURE) ×3 IMPLANT
SYR 20ML LL LF (SYRINGE) ×3 IMPLANT
SYR 30ML LL (SYRINGE) ×6 IMPLANT
TIBIAL BASE ROT PLAT SZ 7 KNEE (Knees) ×3 IMPLANT
TIP FAN IRRIG PULSAVAC PLUS (DISPOSABLE) ×1 IMPLANT
TOWEL OR 17X26 4PK STRL BLUE (TOWEL DISPOSABLE) ×3 IMPLANT
TOWER CARTRIDGE SMART MIX (DISPOSABLE) ×3 IMPLANT
TRAY FOLEY MTR SLVR 16FR STAT (SET/KITS/TRAYS/PACK) ×3 IMPLANT
WRAPON POLAR PAD KNEE (MISCELLANEOUS) ×3

## 2019-06-01 NOTE — H&P (Signed)
The patient has been re-examined, and the chart reviewed, and there have been no interval changes to the documented history and physical.    The risks, benefits, and alternatives have been discussed at length. The patient expressed understanding of the risks benefits and agreed with plans for surgical intervention.  Treylin Burtch P. Yul Diana, Jr. M.D.    

## 2019-06-01 NOTE — Anesthesia Post-op Follow-up Note (Signed)
Anesthesia QCDR form completed.        

## 2019-06-01 NOTE — Op Note (Signed)
OPERATIVE NOTE  DATE OF SURGERY:  06/01/2019  PATIENT NAME:  Richard Davies   DOB: 1951-02-15  MRN: [id number]  PRE-OPERATIVE DIAGNOSIS: Degenerative arthrosis of the left knee, primary  POST-OPERATIVE DIAGNOSIS:  Same  PROCEDURE:  Left total knee arthroplasty using computer-assisted navigation  SURGEON:  Marciano Sequin. M.D.  ASSISTANT: Cassell Smiles, PA-C (present and scrubbed throughout the case, critical for assistance with exposure, retraction, instrumentation, and closure)  ANESTHESIA: spinal  ESTIMATED BLOOD LOSS: 50 mL  FLUIDS REPLACED: 1100 mL of crystalloid  TOURNIQUET TIME: 88 minutes  DRAINS: 2 medium Hemovac drains  SOFT TISSUE RELEASES: Anterior cruciate ligament, posterior cruciate ligament, deep medial collateral ligament, patellofemoral ligament  IMPLANTS UTILIZED: DePuy Attune size 6 posterior stabilized femoral component (cemented), size 7 rotating platform tibial component (cemented), 38 mm medialized dome patella (cemented), and a 5 mm stabilized rotating platform polyethylene insert.  INDICATIONS FOR SURGERY: Richard Davies is a 68 y.o. year old male with a long history of progressive knee pain. X-rays demonstrated severe degenerative changes in tricompartmental fashion. The patient had not seen any significant improvement despite conservative nonsurgical intervention. After discussion of the risks and benefits of surgical intervention, the patient expressed understanding of the risks benefits and agree with plans for total knee arthroplasty.   The risks, benefits, and alternatives were discussed at length including but not limited to the risks of infection, bleeding, nerve injury, stiffness, blood clots, the need for revision surgery, cardiopulmonary complications, among others, and they were willing to proceed.  PROCEDURE IN DETAIL: The patient was brought into the operating room and, after adequate spinal anesthesia was achieved, a tourniquet was placed  on the patient's upper thigh. The patient's knee and leg were cleaned and prepped with alcohol and DuraPrep and draped in the usual sterile fashion. A "timeout" was performed as per usual protocol. The lower extremity was exsanguinated using an Esmarch, and the tourniquet was inflated to 300 mmHg. An anterior longitudinal incision was made followed by a standard mid vastus approach. The deep fibers of the medial collateral ligament were elevated in a subperiosteal fashion off of the medial flare of the tibia so as to maintain a continuous soft tissue sleeve. The patella was subluxed laterally and the patellofemoral ligament was incised. Inspection of the knee demonstrated severe degenerative changes with full-thickness loss of articular cartilage. Osteophytes were debrided using a rongeur. Anterior and posterior cruciate ligaments were excised. Two 4.0 mm Schanz pins were inserted in the femur and into the tibia for attachment of the array of trackers used for computer-assisted navigation. Hip center was identified using a circumduction technique. Distal landmarks were mapped using the computer. The distal femur and proximal tibia were mapped using the computer. The distal femoral cutting guide was positioned using computer-assisted navigation so as to achieve a 5 distal valgus cut. The femur was sized and it was felt that a size 6 femoral component was appropriate. A size 6 femoral cutting guide was positioned and the anterior cut was performed and verified using the computer. This was followed by completion of the posterior and chamfer cuts. Femoral cutting guide for the central box was then positioned in the center box cut was performed.  Attention was then directed to the proximal tibia. Medial and lateral menisci were excised. The extramedullary tibial cutting guide was positioned using computer-assisted navigation so as to achieve a 0 varus-valgus alignment and 3 posterior slope. The cut was performed and  verified using the computer. The proximal tibia was  sized and it was felt that a size 7 tibial tray was appropriate. Tibial and femoral trials were inserted followed by insertion of a 5 mm polyethylene insert. This allowed for excellent mediolateral soft tissue balancing both in flexion and in full extension. Finally, the patella was cut and prepared so as to accommodate a 38 mm medialized dome patella. A patella trial was placed and the knee was placed through a range of motion with excellent patellar tracking appreciated. The femoral trial was removed after debridement of posterior osteophytes. The central post-hole for the tibial component was reamed followed by insertion of a keel punch. Tibial trials were then removed. Cut surfaces of bone were irrigated with copious amounts of normal saline with antibiotic solution using pulsatile lavage and then suctioned dry. Polymethylmethacrylate cement was prepared in the usual fashion using a vacuum mixer. Cement was applied to the cut surface of the proximal tibia as well as along the undersurface of a size 7 rotating platform tibial component. Tibial component was positioned and impacted into place. Excess cement was removed using Personal assistant. Cement was then applied to the cut surfaces of the femur as well as along the posterior flanges of the size 6 femoral component. The femoral component was positioned and impacted into place. Excess cement was removed using Personal assistant. A 5 mm polyethylene trial was inserted and the knee was brought into full extension with steady axial compression applied. Finally, cement was applied to the backside of a 38 mm medialized dome patella and the patellar component was positioned and patellar clamp applied. Excess cement was removed using Personal assistant. After adequate curing of the cement, the tourniquet was deflated after a total tourniquet time of 88 minutes. Hemostasis was achieved using electrocautery. The knee was  irrigated with copious amounts of normal saline with antibiotic solution using pulsatile lavage and then suctioned dry. 20 mL of 1.3% Exparel and 60 mL of 0.25% Marcaine in 40 mL of normal saline was injected along the posterior capsule, medial and lateral gutters, and along the arthrotomy site. A 5 mm stabilized rotating platform polyethylene insert was inserted and the knee was placed through a range of motion with excellent mediolateral soft tissue balancing appreciated and excellent patellar tracking noted. 2 medium drains were placed in the wound bed and brought out through separate stab incisions. The medial parapatellar portion of the incision was reapproximated using interrupted sutures of #1 Vicryl. Subcutaneous tissue was approximated in layers using first #0 Vicryl followed #2-0 Vicryl. The skin was approximated with skin staples. A sterile dressing was applied.  The patient tolerated the procedure well and was transported to the recovery room in stable condition.     P. Angie Fava., M.D.

## 2019-06-01 NOTE — Anesthesia Procedure Notes (Signed)
Spinal  Patient location during procedure: OR Start time: 06/01/2019 11:25 AM End time: 06/01/2019 11:32 AM Staffing Resident/CRNA: Bernardo Heater, CRNA Performed: resident/CRNA  Preanesthetic Checklist Completed: patient identified, site marked, surgical consent, pre-op evaluation, timeout performed, IV checked, risks and benefits discussed and monitors and equipment checked Spinal Block Patient position: sitting Prep: ChloraPrep Patient monitoring: heart rate, continuous pulse ox, blood pressure and cardiac monitor Approach: midline Location: L2-3 Injection technique: single-shot Needle Needle type: Introducer and Pencan  Needle gauge: 24 G Needle length: 9 cm Additional Notes Negative paresthesia. Negative blood return. Positive free-flowing CSF. Expiration date of kit checked and confirmed. Patient tolerated procedure well, without complications.

## 2019-06-01 NOTE — Transfer of Care (Signed)
Immediate Anesthesia Transfer of Care Note  Patient: Richard Davies  Procedure(s) Performed: COMPUTER ASSISTED TOTAL KNEE ARTHROPLASTY (Left Knee)  Patient Location: PACU  Anesthesia Type:Spinal  Level of Consciousness: drowsy and patient cooperative  Airway & Oxygen Therapy: Patient Spontanous Breathing and Patient connected to nasal cannula oxygen  Post-op Assessment: Report given to RN and Post -op Vital signs reviewed and stable  Post vital signs: Reviewed and stable  Last Vitals:  Vitals Value Taken Time  BP 120/74 06/01/19 1519  Temp 36 C 06/01/19 1519  Pulse 64 06/01/19 1523  Resp 30 06/01/19 1523  SpO2 96 % 06/01/19 1523  Vitals shown include unvalidated device data.  Last Pain:  Vitals:   06/01/19 0957  TempSrc: Tympanic  PainSc: 0-No pain         Complications: No apparent anesthesia complications

## 2019-06-01 NOTE — Anesthesia Preprocedure Evaluation (Signed)
Anesthesia Evaluation  Patient identified by MRN, date of birth, ID band Patient awake    Reviewed: Allergy & Precautions, H&P , NPO status , Patient's Chart, lab work & pertinent test results  History of Anesthesia Complications (+) PROLONGED EMERGENCE and history of anesthetic complications  Airway Mallampati: III  TM Distance: >3 FB Neck ROM: full    Dental  (+) Teeth Intact   Pulmonary sleep apnea ,           Cardiovascular negative cardio ROS       Neuro/Psych negative neurological ROS  negative psych ROS   GI/Hepatic negative GI ROS, Neg liver ROS,   Endo/Other  negative endocrine ROS  Renal/GU      Musculoskeletal   Abdominal   Peds  Hematology negative hematology ROS (+)   Anesthesia Other Findings Past Medical History: No date: Arthritis No date: Arthrosis of knee No date: Arthrosis of knee No date: 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 No date: Environmental allergies No date: Sleep apnea     Comment:  NO CPAP  Past Surgical History: 07/02/2017: COLONOSCOPY WITH PROPOFOL; N/A     Comment:  Procedure: COLONOSCOPY WITH PROPOFOL;  Surgeon:               Lollie Sails, MD;  Location: [facility];                Service: Endoscopy;  Laterality: N/A; 2007: FOOT SURGERY     Comment:  R big toe shortened 2012: HERNIA REPAIR     Comment:  umbilical No date: INCISION AND DRAINAGE ABSCESS ANAL 2011: incision and drainage rectal abcess 2008: INGUINAL HERNIA REPAIR; Left No date: JOINT REPLACEMENT     Comment:  right knee 04/01/2017: KNEE ARTHROPLASTY; Right     Comment:  Procedure: COMPUTER ASSISTED TOTAL KNEE ARTHROPLASTY;                Surgeon: Dereck Leep, MD;  Location: [facility];                Service: Orthopedics;  Laterality: Right; 2015: ROTATOR CUFF REPAIR; Right 04/05/2016: SHOULDER ARTHROSCOPY WITH OPEN ROTATOR CUFF REPAIR; Left     Comment:  Procedure: SHOULDER  ARTHROSCOPY WITH OPEN ROTATOR CUFF               REPAIR;  Surgeon: Corky Mull, MD;  Location: [facility];               Service: Orthopedics;  Laterality: Left; 04/05/2016: SHOULDER ARTHROSCOPY WITH SUBACROMIAL DECOMPRESSION; Left     Comment:  Procedure: SHOULDER ARTHROSCOPY WITH SUBACROMIAL               DECOMPRESSION AND LIMITED DEBRIDMENT;  Surgeon: Corky Mull, MD;  Location: [facility];  Service: Orthopedics;                Laterality: Left;     Reproductive/Obstetrics negative OB ROS                             Anesthesia Physical Anesthesia Plan  ASA: II  Anesthesia Plan: Spinal   Post-op Pain Management:    Induction:   PONV Risk Score and Plan: Propofol infusion  Airway Management Planned: Simple Face Mask and Natural Airway  Additional Equipment:   Intra-op Plan:   Post-operative Plan:  Informed Consent: I have reviewed the patients History and Physical, chart, labs and discussed the procedure including the risks, benefits and alternatives for the proposed anesthesia with the patient or authorized representative who has indicated his/her understanding and acceptance.     Dental Advisory Given  Plan Discussed with: Anesthesiologist, CRNA and Surgeon  Anesthesia Plan Comments:         Anesthesia Quick Evaluation

## 2019-06-02 ENCOUNTER — Encounter: Payer: Self-pay | Admitting: Orthopedic Surgery

## 2019-06-02 NOTE — TOC Benefit Eligibility Note (Signed)
Transition of Care [facility]) Benefit Eligibility Note    Patient Details  Name: Richard Davies MRN: [id number] Date of Birth: Jan 05, 1951   Medication/Dose: Enoxaparin 40mg  once daily for 14 days  Covered?: Yes  Prescription Coverage Preferred Pharmacy: [facility]  Spoke with Person/Company/Phone Number:: Neoma Laming with [employer] at [phone]  Co-Pay: $166.40 estimated copay  Prior Approval: No  Deductible: Unmet   Dannette Barbara Phone Number: [phone] or [phone] 06/02/2019, 10:13 AM

## 2019-06-02 NOTE — Evaluation (Signed)
Occupational Therapy Evaluation Patient Details Name: Richard Davies MRN: [id number] DOB: 12-01-1950 Today's Date: 06/02/2019    History of Present Illness Pt is ia 68 y/o M s/p L TKA (06/01/2019) secondary to history of progressive knee pain and tricompartmental degenerative changes.   Clinical Impression   Pt seen for OT evaluation this date, POD#1 from above surgery. PMHx includes R TKA in past couple years. Pt was independent in all ADL and mobility prior to surgery and pt is eager to return to PLOF with less pain and improved safety and independence. Pt currently requires PRN minimal assist for LB dressing while in seated position due to pain and limited AROM of L knee. Pt instructed in polar care mgt, falls prevention strategies, home/routines modifications, DME/AE for LB bathing and dressing tasks, and compression stocking mgt. Handout provided. Do not currently anticipate any additional skilled OT needs following at this time. Will sign off. Please re-consult if additional needs arise during this hospitalization.      Follow Up Recommendations  No OT follow up    Equipment Recommendations  None recommended by OT    Recommendations for Other Services       Precautions / Restrictions Precautions Precautions: Fall Restrictions Weight Bearing Restrictions: Yes LLE Weight Bearing: Weight bearing as tolerated      Mobility Bed Mobility             General bed mobility comments: deferred, up in recliner  Transfers Overall transfer level: Needs assistance Equipment used: Rolling walker (2 wheeled) Transfers: Sit to/from Stand Sit to Stand: Min guard         General transfer comment: CGA for safety; demos safe hand placement with RW    Balance Overall balance assessment: Needs assistance Sitting-balance support: No upper extremity supported Sitting balance-Leahy Scale: Good Sitting balance - Comments: Able to sit steady edge of edge without UE support    Standing balance support: No upper extremity supported Standing balance-Leahy Scale: Fair Standing balance comment: Able to stand without UE support to adjust gown                           ADL either performed or assessed with clinical judgement   ADL Overall ADL's : Needs assistance/impaired                                       General ADL Comments: PRN Min A for LB ADL tasks, Min A for compression stocking mgt; spouse able to provide needed level of assist     Vision Baseline Vision/History: Wears glasses Wears Glasses: At all times Patient Visual Report: No change from baseline Vision Assessment?: No apparent visual deficits     Perception     Praxis      Pertinent Vitals/Pain Pain Assessment: 0-10 Pain Score: 4  Pain Location: L knee Pain Descriptors / Indicators: Aching Pain Intervention(s): Limited activity within patient's tolerance;Monitored during session;Ice applied;Repositioned     Hand Dominance Right   Extremity/Trunk Assessment Upper Extremity Assessment Upper Extremity Assessment: Overall WFL for tasks assessed   Lower Extremity Assessment Lower Extremity Assessment: LLE deficits/detail LLE Deficits / Details: expected post-op strength/ROM deficits LLE Sensation: WNL   Cervical / Trunk Assessment Cervical / Trunk Assessment: Normal   Communication Communication Communication: No difficulties   Cognition Arousal/Alertness: Awake/alert Behavior During Therapy: WFL for tasks assessed/performed Overall Cognitive Status:  Within Functional Limits for tasks assessed                                     General Comments       Exercises  Other Exercises: Pt instructed in falls prevention strategies, compression stocking mgt, polar care mgt, pet care considerations, AE/DME, home/routines modifications, and self care skills; handout provided   Shoulder Instructions      Home Living Family/patient expects  to be discharged to:: Private residence Living Arrangements: Spouse/significant other Available Help at Discharge: Family;Available 24 hours/day;Available PRN/intermittently;Other (Comment)(Wife will be at home 24/7 first few days upon DC, then she will return to work and available intermittently) Type of Home: House Home Access: Stairs to enter [employer] of Steps: 6 Entrance Stairs-Rails: [place]: One level     Bathroom Shower/Tub: Occupational psychologist: [place]: Environmental consultant - 2 wheels;Bedside commode;Cane - single point;Grab bars - tub/shower          Prior Functioning/Environment Level of Independence: Independent        Comments: Pt works as a Designer, jewellery Problem List: Decreased strength;Pain;Decreased range of motion      OT Treatment/Interventions:      OT Goals(Current goals can be found in the care plan section) Acute Rehab OT Goals Patient Stated Goal: go home OT Goal Formulation: All assessment and education complete, DC therapy  OT Frequency:     Barriers to D/C:            Co-evaluation              AM-PAC OT "6 Clicks" Daily Activity     Outcome Measure Help from another person eating meals?: None Help from another person taking care of personal grooming?: None Help from another person toileting, which includes using toliet, bedpan, or urinal?: A Little Help from another person bathing (including washing, rinsing, drying)?: A Little Help from another person to put on and taking off regular upper body clothing?: None Help from another person to put on and taking off regular lower body clothing?: A Little 6 Click Score: 21   End of Session    Activity Tolerance: Patient tolerated treatment well Patient left: in chair;with call bell/phone within reach;with chair alarm set;Other (comment)(polar care in place, rolled towel under L ankle)  OT Visit Diagnosis: Other abnormalities of  gait and mobility (R26.89);Pain Pain - Right/Left: Left Pain - part of body: Knee                Time: 8546-2703 OT Time Calculation (min): 11 min Charges:  OT General Charges $OT Visit: 1 Visit OT Evaluation $OT Eval Low Complexity: 1 Low OT Treatments $Self Care/Home Management : 8-22 mins  Jeni Salles, MPH, MS, OTR/L ascom [phone] 06/02/19, 10:05 AM

## 2019-06-02 NOTE — Evaluation (Signed)
Physical Therapy Evaluation Patient Details Name: Richard Davies MRN: [id number] DOB: [birth year] Today's Date: 06/02/2019   History of Present Illness  Pt is ia 68 y/o M s/p L TKA (06/01/2019) secondary to history of progressive knee pain and tricompartmental degenerative changes. Pt with PMH of sleep apnea. Pt was previously working as a Games developer and generally active.  Clinical Impression  Pt is a pleasant 68 year old male 1 day s/p L TKA (06/01/2019). Pt performs bed mobility, transfers, and ambulation with mod I and CGA for safety. Pt demonstrates deficits with strength, balance, and range of motion. Pt requiring mod I for bed mobility for extra time. Pt performs STS transfer requiring CGA for safety and extra time, demonstrating safe hand placement to push from surface and reach for RW upon standing. During pivot transfer cuing provided to maintain close proximity to RW for safety with pt receptive and demonstrating improvement. Pt ambulates 250' with RW requiring CGA for safety with only mild increase in pain; cuing provided to increase L knee flexion during swing phase and improved L heel strike, with pt receptive and able to demo improvement. Pt's PLOF was generally active, working as a Games developer and has sufficient help available at home. Recommend DC home with OP PT services.     Follow Up Recommendations Outpatient PT    Equipment Recommendations  Rolling walker with 5" wheels    Recommendations for Other Services       Precautions / Restrictions Precautions Precautions: Fall Restrictions Weight Bearing Restrictions: Yes LLE Weight Bearing: Weight bearing as tolerated      Mobility  Bed Mobility Overal bed mobility: Modified Independent             General bed mobility comments: Requiring extra time  Transfers Overall transfer level: Needs assistance Equipment used: Rolling walker (2 wheeled) Transfers: Sit to/from Stand Sit to Stand: Min guard          General transfer comment: CGA for safety; demos safe hand placement with RW  Ambulation/Gait Ambulation/Gait assistance: Min guard Gait Distance (Feet): 250 Feet Assistive device: Rolling walker (2 wheeled) Gait Pattern/deviations: Step-through pattern;Decreased step length - left;Decreased dorsiflexion - left     General Gait Details: Able to increase L knee flexion during swing phase and improve L heel strike with cuing. Pain increasing some with gait.  Stairs            Wheelchair Mobility    Modified Rankin (Stroke Patients Only)       Balance Overall balance assessment: Needs assistance Sitting-balance support: No upper extremity supported Sitting balance-Leahy Scale: Good Sitting balance - Comments: Able to sit steady edge of edge without UE support   Standing balance support: No upper extremity supported Standing balance-Leahy Scale: Fair Standing balance comment: Able to stand without UE support to adjust gown                             Pertinent Vitals/Pain Pain Assessment: 0-10 Pain Score: 3  Pain Location: L knee Pain Descriptors / Indicators: Aching Pain Intervention(s): Limited activity within patient's tolerance;Monitored during session;Ice applied    Home Living Family/patient expects to be discharged to:: Private residence Living Arrangements: Spouse/significant other Available Help at Discharge: Family;Available 24 hours/day;Available PRN/intermittently;Other (Comment)(Wife will be at home 24/7 first few days upon DC, then she will return to work and available intermittently) Type of Home: House Home Access: Stairs to enter Entrance Stairs-Rails: Psychiatric nurse of  Steps: 6 Home Layout: One level Home Equipment: Walker - 2 wheels;Bedside commode;Cane - single point      Prior Function Level of Independence: Independent         Comments: Pt works as a Microbiologist   Dominant Hand:  Right    Extremity/Trunk Assessment   Upper Extremity Assessment Upper Extremity Assessment: Overall WFL for tasks assessed    Lower Extremity Assessment Lower Extremity Assessment: LLE deficits/detail LLE Deficits / Details: ROM +10 to 90 deg LLE Sensation: WNL    Cervical / Trunk Assessment Cervical / Trunk Assessment: Normal  Communication   Communication: No difficulties  Cognition Arousal/Alertness: Awake/alert Behavior During Therapy: WFL for tasks assessed/performed Overall Cognitive Status: Within Functional Limits for tasks assessed                                        General Comments      Exercises Total Joint Exercises Ankle Circles/Pumps: AROM;Both;20 reps Quad Sets: Strengthening;Left;10 reps Goniometric ROM: +10 to 90 Marching in Standing: AROM;Both;20 reps   Assessment/Plan    PT Assessment Patient needs continued PT services  PT Problem List Decreased strength;Decreased coordination;Pain;Decreased range of motion;Decreased activity tolerance;Decreased balance;Decreased mobility       PT Treatment Interventions DME instruction;Therapeutic exercise;Gait training;Balance training;Manual techniques;Stair training;Neuromuscular re-education;Modalities;Functional mobility training;Therapeutic activities;Patient/family education    PT Goals (Current goals can be found in the Care Plan section)  Acute Rehab PT Goals Patient Stated Goal: to get up and walk PT Goal Formulation: With patient Time For Goal Achievement: 06/16/19 Potential to Achieve Goals: Good    Frequency BID   Barriers to discharge        Co-evaluation               AM-PAC PT "6 Clicks" Mobility  Outcome Measure Help needed turning from your back to your side while in a flat bed without using bedrails?: None Help needed moving from lying on your back to sitting on the side of a flat bed without using bedrails?: A Little Help needed moving to and from a bed  to a chair (including a wheelchair)?: A Little Help needed standing up from a chair using your arms (e.g., wheelchair or bedside chair)?: A Little Help needed to walk in hospital room?: A Little Help needed climbing 3-5 steps with a railing? : A Little 6 Click Score: 19    End of Session Equipment Utilized During Treatment: Gait belt Activity Tolerance: Patient tolerated treatment well Patient left: in chair;with call bell/phone within reach;with chair alarm set;with SCD's reapplied Nurse Communication: Mobility status PT Visit Diagnosis: Muscle weakness (generalized) (M62.81);Difficulty in walking, not elsewhere classified (R26.2);Unsteadiness on feet (R26.81);Pain Pain - Right/Left: Left Pain - part of body: Knee    Time: 7619-5093 PT Time Calculation (min) (ACUTE ONLY): 26 min   Charges:   PT Evaluation $PT Eval Moderate Complexity: 1 Mod PT Treatments $Therapeutic Exercise: 8-22 mins        Kendal Hymen, PT, DPT 06/02/19, 9:56 AM

## 2019-06-02 NOTE — Progress Notes (Signed)
  Subjective: 1 Day Post-Op Procedure(s) (LRB): COMPUTER ASSISTED TOTAL KNEE ARTHROPLASTY (Left) Patient reports pain as  well controlled.   Patient is well, and has had no acute complaints or problems Plan is to go Home after hospital stay. Negative for chest pain and shortness of breath Fever: no Gastrointestinal: negative for nausea and vomiting.  Patient has not had a bowel movement.  Objective: Vital signs in last 24 hours: Temp:  [96.8 F (36 C)-98.7 F (37.1 C)] 97.5 F (36.4 C) (10/20 0410) Pulse Rate:  [48-68] 48 (10/20 0410) Resp:  [14-22] 18 (10/19 2033) BP: (106-145)/(67-97) 123/67 (10/20 0410) SpO2:  [96 %-100 %] 99 % (10/20 0410) Weight:  [82.6 kg] 82.6 kg (10/19 1700)  Intake/Output from previous day:  Intake/Output Summary (Last 24 hours) at 06/02/2019 0727 Last data filed at 06/02/2019 0500 Gross per 24 hour  Intake 3033.57 ml  Output 2800 ml  Net 233.57 ml    Intake/Output this shift: No intake/output data recorded.  Labs: No results for input(s): HGB in the last 72 hours. No results for input(s): WBC, RBC, HCT, PLT in the last 72 hours. No results for input(s): NA, K, CL, CO2, BUN, CREATININE, GLUCOSE, CALCIUM in the last 72 hours. No results for input(s): LABPT, INR in the last 72 hours.   EXAM General - Patient is Alert, Appropriate and Oriented Extremity - Neurovascular intact Dorsiflexion/Plantar flexion intact able to perform a straight leg raise  Dressing/Incision - in place. Polar Care in place.  Motor Function - intact, moving foot and toes well on exam.  Cardiovascular- Regular rate and rhythm, no murmurs/rubs/gallops Respiratory- Lungs clear to auscultation bilaterally Gastrointestinal- active bowel sounds   Assessment/Plan: 1 Day Post-Op Procedure(s) (LRB): COMPUTER ASSISTED TOTAL KNEE ARTHROPLASTY (Left) Active Problems:   Total knee replacement status  Estimated body mass index is 27.67 kg/m as calculated from the  following:   Height as of this encounter: 5\' 8"  (1.727 m).   Weight as of this encounter: 82.6 kg. Advance diet Up with therapy Plan for discharge tomorrow    DVT Prophylaxis - Lovenox, Ted hose and foot pumps Weight-Bearing as tolerated to left leg  Cassell Smiles, PA-C [facility] Orthopaedic Surgery 06/02/2019, 7:27 AM

## 2019-06-02 NOTE — Progress Notes (Signed)
Physical Therapy Treatment Patient Details Name: Richard Davies MRN: [id number] DOB: [birth year] Today's Date: 06/02/2019    History of Present Illness Pt is ia 68 y/o M s/p L TKA (06/01/2019) secondary to history of progressive knee pain and tricompartmental degenerative changes.    PT Comments    Pt continues to report pain is well-controlled and tolerates session well, reporting mild increase in pain with mobility which returns to baseline with rest. Pt performs transfers and ambulation with CGA for safety, requiring verbal cuing for RW management with turns to maintain closer proximity for improved balance; pt ambulates 125' to rehab gym and to in-room bathroom. Pt negotiates 4 stairs with use of bilateral hand rails requiring CGA for safety, demonstrating step-to pattern; minimal verbal cuing required for sequencing with pt receptive. Pt provided with home exercise program and reviewed with pt, performing exercises as described below.  Pt will continue to benefit from skilled PT intervention for improved strength, range of motion and balance to promote return to prior level of function.    Follow Up Recommendations  Outpatient PT     Equipment Recommendations  Rolling walker with 5" wheels    Recommendations for Other Services       Precautions / Restrictions Precautions Precautions: Fall Restrictions Weight Bearing Restrictions: Yes LLE Weight Bearing: Weight bearing as tolerated    Mobility  Bed Mobility               General bed mobility comments: deferred, up in recliner  Transfers Overall transfer level: Needs assistance Equipment used: Rolling walker (2 wheeled) Transfers: Sit to/from Stand Sit to Stand: Min guard         General transfer comment: CGA for safety; demos safe hand placement with RW  Ambulation/Gait Ambulation/Gait assistance: Min guard;Supervision Gait Distance (Feet): 125 Feet Assistive device: Rolling walker (2 wheeled) Gait  Pattern/deviations: Step-through pattern;Decreased step length - left     General Gait Details: Cues for increased knee flexion during swing phase, slight posterior lean noted during L swing phase for improved clearance. Pain increasing some with gait.   Stairs             Wheelchair Mobility    Modified Rankin (Stroke Patients Only)       Balance Overall balance assessment: Needs assistance Sitting-balance support: No upper extremity supported Sitting balance-Leahy Scale: Good     Standing balance support: No upper extremity supported Standing balance-Leahy Scale: Good Standing balance comment: Able to stand without UE support to adjust gown                            Cognition Arousal/Alertness: Awake/alert Behavior During Therapy: WFL for tasks assessed/performed Overall Cognitive Status: Within Functional Limits for tasks assessed                                        Exercises Total Joint Exercises Quad Sets: Strengthening;Left;10 reps Straight Leg Raises: Strengthening;Left;10 reps Long Arc Quad: Left;10 reps Knee Flexion: AROM;Left;5 reps;Seated    General Comments        Pertinent Vitals/Pain Pain Assessment: 0-10 Pain Score: 3  Pain Location: L knee Pain Descriptors / Indicators: Aching Pain Intervention(s): Limited activity within patient's tolerance;Monitored during session;Ice applied    Home Living  Prior Function            PT Goals (current goals can now be found in the care plan section) Acute Rehab PT Goals Patient Stated Goal: go home PT Goal Formulation: With patient Time For Goal Achievement: 06/16/19 Potential to Achieve Goals: Good    Frequency    BID      PT Plan Current plan remains appropriate    Co-evaluation              AM-PAC PT "6 Clicks" Mobility   Outcome Measure  Help needed turning from your back to your side while in a flat bed without  using bedrails?: None Help needed moving from lying on your back to sitting on the side of a flat bed without using bedrails?: A Little Help needed moving to and from a bed to a chair (including a wheelchair)?: A Little Help needed standing up from a chair using your arms (e.g., wheelchair or bedside chair)?: A Little Help needed to walk in hospital room?: A Little Help needed climbing 3-5 steps with a railing? : A Little 6 Click Score: 19    End of Session Equipment Utilized During Treatment: Gait belt Activity Tolerance: Patient tolerated treatment well Patient left: in chair;with call bell/phone within reach;with chair alarm set;with SCD's reapplied Nurse Communication: Mobility status PT Visit Diagnosis: Muscle weakness (generalized) (M62.81);Difficulty in walking, not elsewhere classified (R26.2);Unsteadiness on feet (R26.81);Pain Pain - Right/Left: Left Pain - part of body: Knee     Time: 1497-0263 PT Time Calculation (min) (ACUTE ONLY): 25 min  Charges:  $Therapeutic Exercise: 8-22 mins $Therapeutic Activity: 8-22 mins                     Kendal Hymen, PT, DPT 06/02/19, 2:12 PM

## 2019-06-02 NOTE — TOC Initial Note (Signed)
Transition of Care [facility]) - Initial/Assessment Note    Patient Details  Name: Richard Davies MRN: [id number] Date of Birth: Aug 23, 1950  Transition of Care [facility]) CM/SW Contact:    Mea Ozga, Lenice Llamas Phone Number: [phone]  06/02/2019, 1:53 PM  Clinical Narrative: Clinical Social Worker (CSW) met with patient alone at bedside to discuss D/C plan. PT is recommending outpatient PT. CSW introduced self and explained role of CSW department. Patient reported that he lives in [place] with his wife Richard Davies. Patient reported that he has a walker and bedside commode at home from a previous surgery. Patient reported that he prefers to go to outpatient PT and has an appointment at [facility] on Friday October 23. Patient reported that his wife or other family members will transport him to his appointments. CSW contacted [facility] and confirmed that patient has his first outpatient PT appointment scheduled for Friday October 23 at 12:30 pm. CSW made patient aware of appointment time. CSW also made patient aware of his Lovenox price $166.40. Patient reported that he can afford the Lovenox price. CSW also provided patient with a good Rx card and explained that it will bring the price of Lovenox down to around $90 however it will not go towards his insurance deductible. Patient verbalized his understanding. Patient reported no other needs or concerns. MD is aware of outpatient PT. CSW will continue to follow and assist as needed.      Expected Discharge Plan: OP Rehab Barriers to Discharge: Continued Medical Work up   Patient Goals and CMS Choice Patient states their goals for this hospitalization and ongoing recovery are:: To go home.      Expected Discharge Plan and Services Expected Discharge Plan: OP Rehab In-house Referral: Clinical Social Work     Living arrangements for the past 2 months: Single Family Home                 DME Arranged: N/A         HH Arranged: NA           Prior Living Arrangements/Services Living arrangements for the past 2 months: Single Family Home Lives with:: Spouse Patient language and need for interpreter reviewed:: No Do you feel safe going back to the place where you live?: Yes      Need for Family Participation in Patient Care: No (Comment) Care giver support system in place?: Yes (comment) Current home services: DME(Patient reported that he has a walker and bedside commode at home.) Criminal Activity/Legal Involvement Pertinent to Current Situation/Hospitalization: No - Comment as needed  Activities of Daily Living Home Assistive Devices/Equipment: Eyeglasses ADL Screening (condition at time of admission) Patient's cognitive ability adequate to safely complete daily activities?: Yes Is the patient deaf or have difficulty hearing?: No Does the patient have difficulty seeing, even when wearing glasses/contacts?: No Does the patient have difficulty concentrating, remembering, or making decisions?: No Patient able to express need for assistance with ADLs?: Yes Does the patient have difficulty dressing or bathing?: No Independently performs ADLs?: Yes (appropriate for developmental age) Does the patient have difficulty walking or climbing stairs?: No Weakness of Legs: None Weakness of Arms/Hands: None  Permission Sought/Granted Permission sought to share information with : Other (comment)([facility]) Permission granted to share information with : Yes, Verbal Permission Granted              Emotional Assessment Appearance:: Appears stated age Attitude/Demeanor/Rapport: Engaged Affect (typically observed): Pleasant, Calm Orientation: : Oriented to Self,  Oriented to Place, Oriented to  Time, Oriented to Situation Alcohol / Substance Use: Not Applicable Psych Involvement: No (comment)  Admission diagnosis:  PRIMARY OSTEOARTHRITIS OF LEFT KNEE. Patient Active Problem List   Diagnosis Date Noted  . Total  knee replacement status 06/01/2019  . Primary osteoarthritis of left knee 04/08/2018  . Status post total right knee replacement 04/01/2017   PCP:  Dion Body, MD Pharmacy:   [id number] [id number]: [phone]: [phone]    Social Determinants of Health (SDOH) Interventions    Readmission Risk Interventions No flowsheet data found.

## 2019-06-02 NOTE — TOC Progression Note (Signed)
Transition of Care [facility]) - Progression Note    Patient Details  Name: Richard Davies MRN: [id number] Date of Birth: 1950/11/17  Transition of Care [facility]) CM/SW Contact  Jacklyne Baik, Lenice Llamas Phone Number: [phone]  06/02/2019, 9:05 AM  Clinical Narrative: Lovenox price requested.          Expected Discharge Plan and Services                                                 Social Determinants of Health (SDOH) Interventions    Readmission Risk Interventions No flowsheet data found.

## 2019-06-03 MED ORDER — ENOXAPARIN SODIUM 40 MG/0.4ML ~~LOC~~ SOLN: 40.0000 mg | SUBCUTANEOUS | 0 refills | Status: DC

## 2019-06-03 MED ORDER — CELECOXIB 200 MG PO CAPS: 200.0000 mg | ORAL_CAPSULE | Freq: Two times a day (BID) | ORAL | 0 refills | Status: AC

## 2019-06-03 MED ORDER — OXYCODONE HCL 5 MG PO TABS: 5.0000 mg | ORAL_TABLET | ORAL | 0 refills | Status: AC | PRN

## 2019-06-03 MED ORDER — ASPIRIN EC 81 MG PO TBEC: 81.0000 mg | DELAYED_RELEASE_TABLET | Freq: Two times a day (BID) | ORAL | 0 refills | Status: DC

## 2019-06-03 NOTE — TOC Transition Note (Signed)
Transition of Care [facility]) - CM/SW Discharge Note   Patient Details  Name: Richard Davies MRN: [id number] Date of Birth: 11-03-1950  Transition of Care [facility]) CM/SW Contact:  Jorgen Wolfinger, Lenice Llamas Phone Number: [phone]  06/03/2019, 9:02 AM   Clinical Narrative: Patient will D/C home today and is aware of his outpatient PT appointment on Friday October 23 at 12:30 pm at surgeon's [facility]. Patient reported that he has a walker and bedside commode at home from a previous surgery. Patient is aware of his Lovenox price $166.40 and was given a good Rx card to get a discount. Please reconsult if future social work needs arise. CSW signing off.     Final next level of care: OP Rehab Barriers to Discharge: Barriers Resolved   Patient Goals and CMS Choice Patient states their goals for this hospitalization and ongoing recovery are:: To go home,      Discharge Placement                       Discharge Plan and Services In-house Referral: Clinical Social Work              DME Arranged: N/A         HH Arranged: NA          Social Determinants of Health (SDOH) Interventions     Readmission Risk Interventions No flowsheet data found.

## 2019-06-03 NOTE — Progress Notes (Signed)
  Subjective: 2 Days Post-Op Procedure(s) (LRB): COMPUTER ASSISTED TOTAL KNEE ARTHROPLASTY (Left) Patient reports pain as  well controlled.   Patient is well, and has had no acute complaints or problems Plan is to go Home after hospital stay. Negative for chest pain and shortness of breath Fever: no Gastrointestinal: negative for nausea and vomiting.  Patient has had a bowel movement.  Objective: Vital signs in last 24 hours: Temp:  [97.5 F (36.4 C)-97.7 F (36.5 C)] 97.7 F (36.5 C) (10/20 2318) Pulse Rate:  [52-69] 52 (10/20 2318) Resp:  [18] 18 (10/20 2318) BP: (121-125)/(71-76) 123/71 (10/20 2318) SpO2:  [97 %-100 %] 97 % (10/20 2318)  Intake/Output from previous day:  Intake/Output Summary (Last 24 hours) at 06/03/2019 0627 Last data filed at 06/03/2019 0453 Gross per 24 hour  Intake 480 ml  Output 1135 ml  Net -655 ml    Intake/Output this shift: Total I/O In: -  Out: 810 [Urine:600; Drains:210]  Labs: No results for input(s): HGB in the last 72 hours. No results for input(s): WBC, RBC, HCT, PLT in the last 72 hours. No results for input(s): NA, K, CL, CO2, BUN, CREATININE, GLUCOSE, CALCIUM in the last 72 hours. No results for input(s): LABPT, INR in the last 72 hours.   EXAM General - Patient is Alert, Appropriate and Oriented Extremity - Neurovascular intact Dorsiflexion/Plantar flexion intact Dressing/Incision -dressing remains in place.  Polar Care in place and on.  Following takedown of dressing, minimal drainage noted over the incision.  Drain remains in place. Motor Function - intact, moving foot and toes well on exam.  Cardiovascular- Regular rate and rhythm, no murmurs/rubs/gallops Respiratory- Lungs clear to auscultation bilaterally Gastrointestinal- active bowel sounds   Assessment/Plan: 2 Days Post-Op Procedure(s) (LRB): COMPUTER ASSISTED TOTAL KNEE ARTHROPLASTY (Left) Active Problems:   Total knee replacement status  Estimated body mass  index is 27.67 kg/m as calculated from the following:   Height as of this encounter: 5\' 8"  (1.727 m).   Weight as of this encounter: 82.6 kg. Advance diet Up with therapy  Plan for discharge with outpatient PT.  Honeycomb dressing replaced.  Drain removed.  DVT Prophylaxis - Lovenox and foot pumps Weight-Bearing as tolerated to left leg  Cassell Smiles, PA-C [facility] Orthopaedic Surgery 06/03/2019, 6:27 AM

## 2019-06-03 NOTE — Discharge Summary (Signed)
Physician Discharge Summary  Patient ID: Richard Davies MRN: [id number] DOB/AGE: 02/21/1951 68 y.o.  Admit date: 06/01/2019 Discharge date: 06/03/2019  Admission Diagnoses:  PRIMARY OSTEOARTHRITIS OF LEFT KNEE.  Surgeries:  Marciano Sequin. M.D.  ASSISTANT: Cassell Smiles, PA-C (present and scrubbed throughout the case, critical for assistance with exposure, retraction, instrumentation, and closure)  ANESTHESIA: spinal  ESTIMATED BLOOD LOSS: 50 mL  FLUIDS REPLACED: 1100 mL of crystalloid  TOURNIQUET TIME: 88 minutes  DRAINS: 2 medium Hemovac drains  SOFT TISSUE RELEASES: Anterior cruciate ligament, posterior cruciate ligament, deep medial collateral ligament, patellofemoral ligament  IMPLANTS UTILIZED: DePuy Attune size 6 posterior stabilized femoral component (cemented), size 7 rotating platform tibial component (cemented), 38 mm medialized dome patella (cemented), and a 5 mm stabilized rotating platform polyethylene insert.  Discharge Diagnoses: Patient Active Problem List   Diagnosis Date Noted  . Total knee replacement status 06/01/2019  . Primary osteoarthritis of left knee 04/08/2018  . Status post total right knee replacement 04/01/2017    Past Medical History:  Diagnosis Date  . Arthritis   . Arthrosis of knee   . Arthrosis of knee   . 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 Environmental allergies   . Sleep apnea    NO CPAP     Transfusion: n/a   Consultants (if any):   Discharged Condition: Improved  Hospital Course: Richard Davies is an 68 y.o. male who was admitted 06/01/2019 with a diagnosis of left knee osteoarthritis and went to the operating room on 06/01/2019 and underwent left total knee arthroplasty. The patient received perioperative antibiotics for prophylaxis (see below). The patient tolerated the procedure well and was transported to PACU in stable condition. After meeting PACU criteria, the patient was  subsequently transferred to the Orthopaedics/Rehabilitation unit.   The patient received DVT prophylaxis in the form of early mobilization, Lovenox, Foot Pumps and TED hose. A sacral pad had been placed and heels were elevated off of the bed with rolled towels in order to protect skin integrity. Foley catheter was discontinued on postoperative day #1. Wound drains were discontinued on postoperative day #2. The surgical incision was healing well without signs of infection.  Physical therapy was initiated postoperatively for transfers, gait training, and strengthening. Occupational therapy was initiated for activities of daily living and evaluation for assisted devices. Rehabilitation goals were reviewed in detail with the patient. The patient made steady progress with physical therapy and physical therapy recommended discharge to Home.   The patient achieved his preliminary goals of this hospitalization and was felt to be medically and orthopaedically appropriate for discharge.  He was given perioperative antibiotics:  Anti-infectives (From admission, onward)   Start     Dose/Rate Route Frequency Ordered Stop   06/01/19 1800  ceFAZolin (ANCEF) IVPB 2g/100 mL premix     2 g 200 mL/hr over 30 Minutes Intravenous Every 6 hours 06/01/19 1710 06/02/19 1233   06/01/19 1001  clindamycin (CLEOCIN) 900 MG/50ML IVPB    Note to Pharmacy: Milinda Cave   : cabinet override      06/01/19 1001 06/01/19 1144   06/01/19 0600  clindamycin (CLEOCIN) IVPB 900 mg     900 mg 100 mL/hr over 30 Minutes Intravenous On call to O.R. 05/31/19 2141 06/01/19 1154    .  Recent vital signs:  Vitals:   06/02/19 1802 06/02/19 2318  BP: 121/73 123/71  Pulse: (!) 55 (!) 52  Resp: 18 18  Temp: 97.6 F (  36.4 C) 97.7 F (36.5 C)  SpO2: 98% 97%    Recent laboratory studies:  No results for input(s): WBC, HGB, HCT, PLT, K, CL, CO2, BUN, CREATININE, GLUCOSE, CALCIUM, LABPT, INR in the last 72 hours.  Diagnostic  Studies: Dg Knee Left Port  Result Date: 06/01/2019 CLINICAL DATA:  Postop knee replacement EXAM: PORTABLE LEFT KNEE - 1-2 VIEW COMPARISON:  None FINDINGS: Status post left hip replacement with normal alignment and intact hardware. Cutaneous staples. Gas in the soft tissues. Suprapatellar drain. No fracture IMPRESSION: Status post left knee replacement with expected postsurgical change Electronically Signed   By: Jasmine Pang M.D.   On: 06/01/2019 16:09    Discharge Medications:   Allergies as of 06/03/2019      Reactions   Codeine Nausea Only   Ciprofloxacin Other (See Comments)   Joint pain      Medication List    TAKE these medications   aspirin EC 81 MG tablet Take 1 tablet (81 mg total) by mouth 2 (two) times daily.   celecoxib 200 MG capsule Commonly known as: CELEBREX Take 1 capsule (200 mg total) by mouth 2 (two) times daily.   enoxaparin 40 MG/0.4ML injection Commonly known as: LOVENOX Inject 0.4 mLs (40 mg total) into the skin daily for 14 days.   MULTIVITAMIN MEN 50+ PO Take 1 tablet by mouth 2 (two) times a week.   oxyCODONE 5 MG immediate release tablet Commonly known as: Oxy IR/ROXICODONE Take 1 tablet (5 mg total) by mouth every 4 (four) hours as needed for up to 7 days for moderate pain (pain score 4-6).   sildenafil 20 MG tablet Commonly known as: REVATIO Take 60-100 mg by mouth daily as needed (ED).            Durable Medical Equipment  (From admission, onward)         Start     Ordered   06/01/19 1710  DME Walker rolling  Once    Question:  Patient needs a walker to treat with the following condition  Answer:  Total knee replacement status   06/01/19 1710   06/01/19 1710  DME Bedside commode  Once    Question:  Patient needs a bedside commode to treat with the following condition  Answer:  Total knee replacement status   06/01/19 1710          Disposition: Discharge disposition: 01-Home or Self Care         Follow-up  Information    Madelyn Flavors, PA-C On 06/16/2019.   Specialty: Orthopedic Surgery Why: at 9:15am Contact information: [address] [facility] [place] [address] [phone]        Donato Heinz, MD.   Specialty: Orthopedic Surgery Contact information: [phone] [facility] [address] [facility] [place] [address] [phone]            Lasandra Beech, PA-C 06/03/2019, 6:38 AM

## 2019-06-03 NOTE — Anesthesia Postprocedure Evaluation (Signed)
Anesthesia Post Note  Patient: Richard Davies  Procedure(s) Performed: COMPUTER ASSISTED TOTAL KNEE ARTHROPLASTY (Left Knee)  Patient location during evaluation: Other Anesthesia Type: Combined General/Spinal Level of consciousness: awake and alert and oriented Pain management: pain level controlled Vital Signs Assessment: post-procedure vital signs reviewed and stable Respiratory status: spontaneous breathing Cardiovascular status: blood pressure returned to baseline Anesthetic complications: no     Last Vitals:  Vitals:   06/03/19 0832 06/03/19 1050  BP: 115/65   Pulse: 63 62  Resp:    Temp: 37.1 C   SpO2: 99% 100%    Last Pain:  Vitals:   06/03/19 0832  TempSrc: Oral  PainSc:                  Kewan Mcnease

## 2019-06-03 NOTE — Progress Notes (Addendum)
Physical Therapy Treatment Patient Details Name: Richard Davies MRN: [id number] DOB: 30-Oct-1950 Today's Date: 06/03/2019    History of Present Illness Pt is ia 68 y/o M s/p L TKA (06/01/2019) secondary to history of progressive knee pain and tricompartmental degenerative changes.    PT Comments    Pt agreeable to PT; denies L knee pain. Observed L honeycomb bandage changed/drain removed; however, gauze at drain site saturated and looks to be actively bleeding. Nursing notified and will address prior to discharge. Pt progressing well. L knee range 6-93 degrees. Education provided for all stretching techniques, frequency and duration. Pt demonstrates Mod I with stand ambulation. Pt prepared to discharge home to continue progression of L knee strength, range of motion and functional mobility towards baseline.   Follow Up Recommendations  Outpatient PT     Equipment Recommendations  Rolling walker with 5" wheels    Recommendations for Other Services       Precautions / Restrictions Precautions Precautions: Fall Restrictions Weight Bearing Restrictions: Yes LLE Weight Bearing: Weight bearing as tolerated    Mobility  Bed Mobility               General bed mobility comments: Not tested; up in chair. Denies difficulty  Transfers Overall transfer level: Modified independent Equipment used: Rolling walker (2 wheeled) Transfers: Sit to/from Stand Sit to Stand: Modified independent (Device/Increase time)         General transfer comment: Education regarding good use of LLE with sit and stand for strength and stretch benefits  Ambulation/Gait Ambulation/Gait assistance: Supervision Gait Distance (Feet): 100 Feet Assistive device: Rolling walker (2 wheeled) Gait Pattern/deviations: Step-through pattern;Decreased stride length     General Gait Details: limited L knee extension with heel strike. Education regarding extension stretching to improve   Stairs Stairs:  (denies difficulty)           Wheelchair Mobility    Modified Rankin (Stroke Patients Only)       Balance Overall balance assessment: Modified Independent                                          Cognition Arousal/Alertness: Awake/alert Behavior During Therapy: WFL for tasks assessed/performed Overall Cognitive Status: Within Functional Limits for tasks assessed                                        Exercises Total Joint Exercises Quad Sets: Strengthening;Left;20 reps(also in stand) Knee Flexion: AROM;Left;10 reps;Seated(3 positions each rep with hold) Goniometric ROM: 6-93 Other Exercises Other Exercises: Education regarding use of gravity assisted L knee extension with/without light weight over CP to assist extension. Education regarding technique of stretching exercises, frequency and duration    General Comments        Pertinent Vitals/Pain Pain Assessment: No/denies pain    Home Living                      Prior Function            PT Goals (current goals can now be found in the care plan section) Progress towards PT goals: Progressing toward goals    Frequency    BID      PT Plan Current plan remains appropriate    Co-evaluation  AM-PAC PT "6 Clicks" Mobility   Outcome Measure  Help needed turning from your back to your side while in a flat bed without using bedrails?: None Help needed moving from lying on your back to sitting on the side of a flat bed without using bedrails?: A Little Help needed moving to and from a bed to a chair (including a wheelchair)?: A Little Help needed standing up from a chair using your arms (e.g., wheelchair or bedside chair)?: A Little Help needed to walk in hospital room?: A Little Help needed climbing 3-5 steps with a railing? : A Little 6 Click Score: 19    End of Session Equipment Utilized During Treatment: Gait belt Activity Tolerance:  Patient tolerated treatment well Patient left: in chair;with call bell/phone within reach;Other (comment)(polar care in place) Nurse Communication: Other (comment)(bandage/drain site with active bleeding) PT Visit Diagnosis: Muscle weakness (generalized) (M62.81);Difficulty in walking, not elsewhere classified (R26.2);Unsteadiness on feet (R26.81);Pain Pain - Right/Left: Left Pain - part of body: Knee     Time: 5784-6962 PT Time Calculation (min) (ACUTE ONLY): 31 min  Charges:  $Gait Training: 8-22 mins $Therapeutic Exercise: 8-22 mins                      Scot Dock, PTA 06/03/2019, 12:00 PM

## 2019-06-04 NOTE — Discharge Planning (Signed)
DISCHARGE NOTE:  Pt given discharge instructions . Pt verbalized understanding, all questions answered. TED hose on both legs, bone foam and polar care sent with pt. Wife at bedside. Pt wheeled to car by staff, wife providing transportation.

## 2021-01-08 IMAGING — DX DG KNEE 1-2V PORT*L*
2 series · 2 of 2 positions shown · non-contrast
Comparison: None

CLINICAL DATA: Postop knee replacement

EXAM:
PORTABLE LEFT KNEE - 1-2 VIEW

[knee ap]
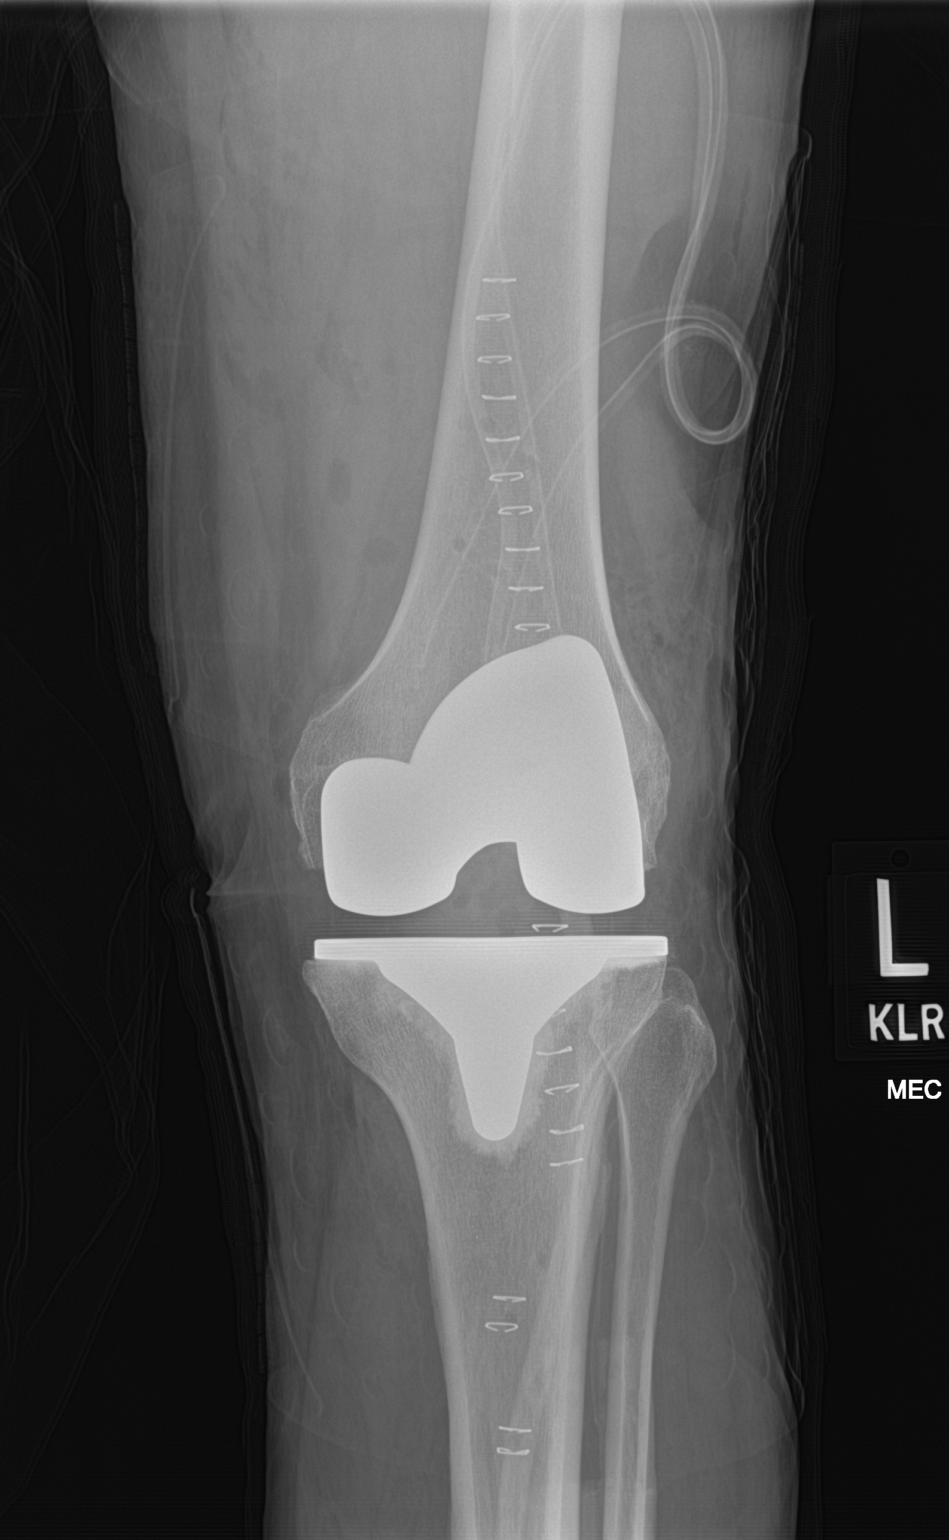

[knee lat]
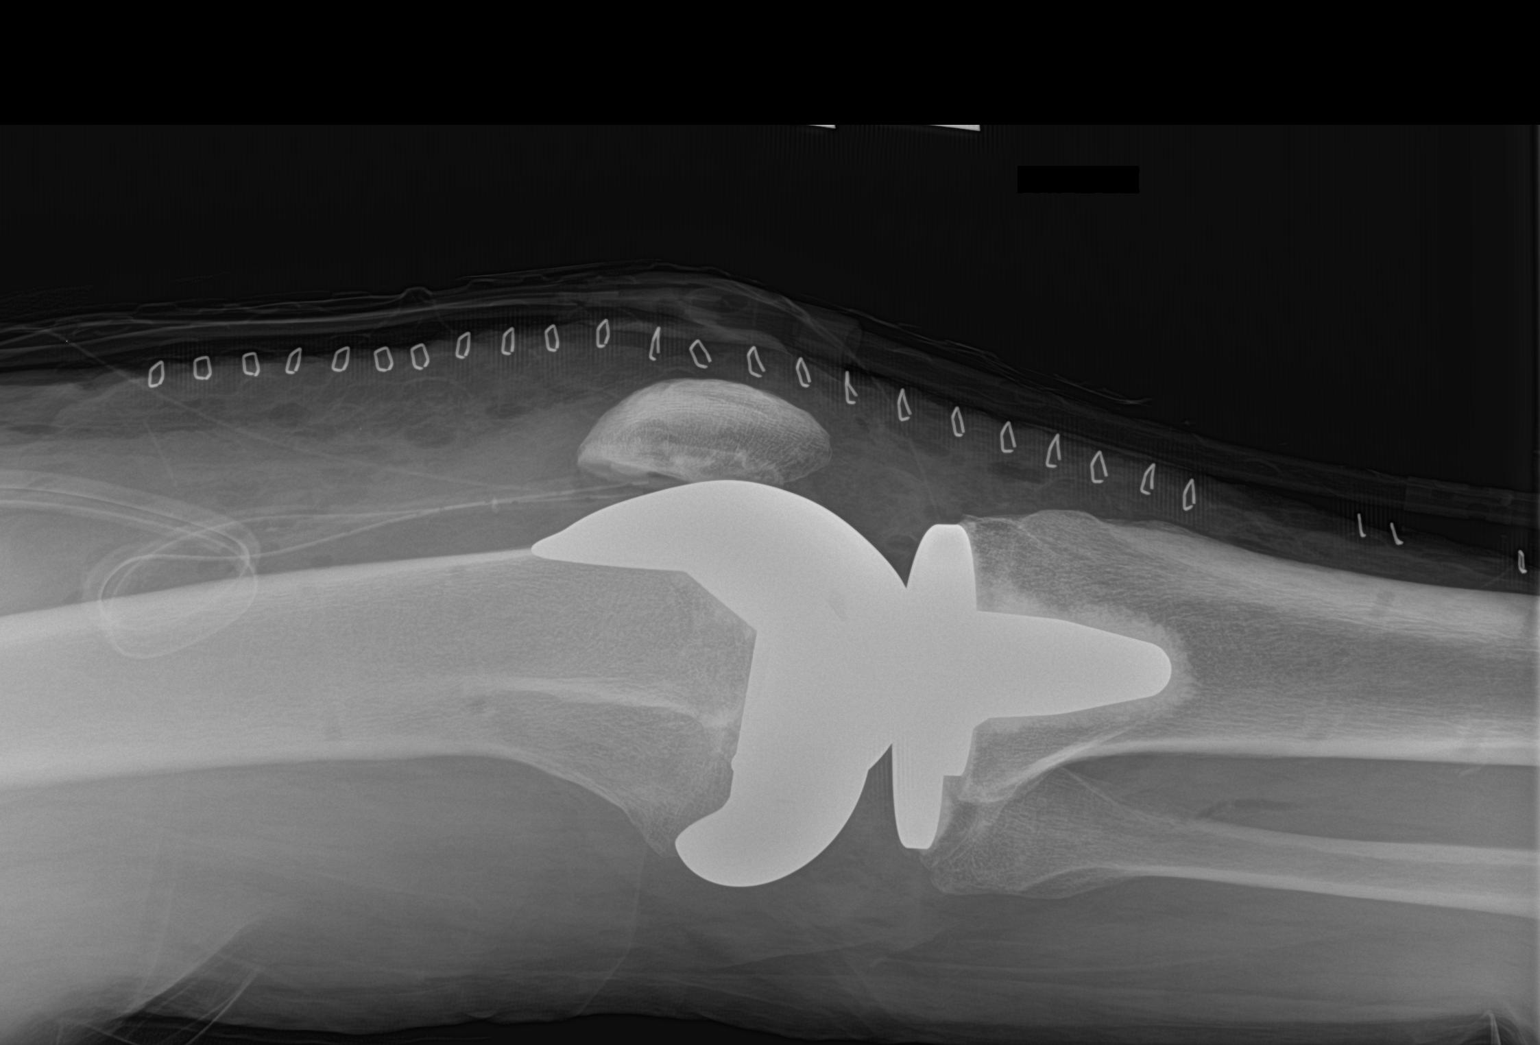

[2 of 2 positions shown; findings below may reference images not displayed]

FINDINGS: Status post left hip replacement with normal alignment and intact
hardware. Cutaneous staples. Gas in the soft tissues. Suprapatellar
drain. No fracture
IMPRESSION: Status post left knee replacement with expected postsurgical change

## 2021-12-07 ENCOUNTER — Ambulatory Visit: Payer: Self-pay | Admitting: General Surgery

## 2021-12-07 NOTE — H&P (View-Only) (Signed)
PATIENT PROFILE: ?Richard Davies is a 70 y.o. male who presents to the Clinic for consultation at the request of Dr. Whitaker for evaluation of umbilical hernia. ?  ?PCP:  Linthavong, Kanhka, MD ?  ?HISTORY OF PRESENT ILLNESS: ?Richard Davies reports he has been feeling umbilical hernia since 2 weeks ago.  Initially when he came out it was tender to palpation but he has been getting softer and less painful.  He denies any episode of abdominal distention nausea and vomiting.  Pain localized to the paramedical area.  There is no radiation.  Aggravating factor is applying pressure.  There is no alleviating factors. ?  ?Patient endorses he had surgery for an umbilical hernia about 10 years ago.  He cannot recall if he had mesh placed.  He endorses that he was in an emergent basis. ?  ?  ?PROBLEM LIST: ?       ?Problem List  Date Reviewed: 07/13/2021  ?        Noted  ?  OSA (obstructive sleep apnea)- Sleep study 09/08/21 (severe) 09/14/2021  ?  Medicare annual wellness visit, initial 07/13/21 07/13/2021  ?  DNR (do not resuscitate) 07/13/2021  ?  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  Status post total right knee replacement 05/17/2017  ?  Pure hypercholesterolemia (LDL 98 - 07/05/21)  11/02/2016  ?  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  S/P rotator cuff repair 01/15/2014  ?  ?  ?GENERAL REVIEW OF SYSTEMS:  ?  ?General ROS: negative for - chills, fatigue, fever, weight gain or weight loss ?Allergy and Immunology ROS: negative for - hives  ?Hematological and Lymphatic ROS: negative for - bleeding problems or bruising, negative for palpable nodes ?Endocrine ROS: negative for - heat or cold intolerance, hair changes ?Respiratory ROS: negative for - cough, shortness of breath or wheezing ?Cardiovascular ROS: no chest pain or palpitations ?GI ROS: negative for nausea, vomiting, abdominal pain, diarrhea, constipation ?Musculoskeletal ROS: negative for - joint swelling or muscle  pain ?Neurological ROS: negative for - confusion, syncope ?Dermatological ROS: negative for pruritus and rash ?Psychiatric: negative for anxiety, depression, difficulty sleeping and memory loss ?  ?MEDICATIONS: ?Current Medications  ?      ?Current Outpatient Medications  ?Medication Sig Dispense Refill  ? amoxicillin (AMOXIL) 500 MG capsule Take 4 capsules 1 hour before the dental procedure 4 capsule 2  ? cetirizine (ZYRTEC) 10 MG tablet Take 1 tablet (10 mg total) by mouth once daily      ? clindamycin (CLEOCIN) 300 MG capsule TAKE 2 CAPSULES (600 MG TOTAL) BY MOUTH ONCE FOR 1 DOSE ONE HOUR BEFORE DENTAL APPT.      ? lovastatin (MEVACOR) 40 MG tablet TAKE 1 TABLET BY MOUTH EVERY DAY WITH DINNER 90 tablet 1  ? multivit-min-FA-lycopen-lutein (CENTRUM SILVER ULTRA MEN'S) 300-600-300 mcg Tab Take 1 tablet by mouth once daily      ? omeprazole (PRILOSEC) 20 MG DR capsule Take 20 mg by mouth once daily      ? sildenafil (REVATIO) 20 mg tablet 3-5 tablets po daily as needed      ?  ?No current facility-administered medications for this visit.  ?  ?  ?  ?ALLERGIES: ?Ciprofloxacin, Codeine, and Penicillins ?  ?PAST MEDICAL HISTORY: ?    ?Past Medical History:  ?Diagnosis Date  ? Environmental allergies    ? History of hernia repair    ?  ?  ?PAST SURGICAL HISTORY: ?     ?  Past Surgical History:  ?Procedure Laterality Date  ? Foot surgery   2009  ? INCISION & DRAINAGE ABSCESS RECTAL   2011  ? COLONOSCOPY   07/03/2010  ?  Dr. M. Skulskie @ [facility] - Adenomatous Polyp, rpt 5 yrs per [facility], ltr mailed  ? REPAIR ROTATOR CUFF TEAR CHRONIC OPEN Right 01/08/14  ? Limited arthroscopic debridement, arthroscopic subacromial decompression and mini-open rotator cuff repair left shoulder  Left 04/05/2016  ?  Dr.Poggi  ? Right total knee arthroplasty using computer-assisted navigation   04/01/2017  ?  Dr Hooten  ? COLONOSCOPY   07/02/2017  ?  Diverticulosis/PHx CP repeat 5 yr. [facility]   ? Left total knee arthroplasty using computer-assisted  navigation   06/01/2019  ?  Dr Hooten  ? HERNIA REPAIR   1980;2013  ?  ?  ?FAMILY HISTORY: ?     ?Family History  ?Problem Relation Age of Onset  ? Prostate cancer Father    ? Brain cancer Brother    ? Prostate cancer Brother    ?  ?  ?SOCIAL HISTORY: ?Social History  ?  ?  ?     ?Socioeconomic History  ? Marital status: Divorced  ? Number of children: 1  ? Years of education: 14  ?Occupational History  ? Occupation: Retired-Working Part-time Carpenter  ?    Comment: self  ?Tobacco Use  ? Smoking status: Never  ? Smokeless tobacco: Former  ?    Types: Chew  ?    Quit date: 12/31/1993  ?Vaping Use  ? Vaping Use: Never used  ?Substance and Sexual Activity  ? Alcohol use: Yes  ?    Alcohol/week: 12.0 standard drinks  ?    Types: 12 Cans of beer per week  ?    Comment: 12 beers per week  ? Drug use: No  ? Sexual activity: Defer  ?    Partners: Female  ?  ?  ?  ?PHYSICAL EXAM: ?   ?Vitals:  ?  12/07/21 1018  ?BP: (!) 147/92  ?Pulse: 71  ?  ?Body mass index is 30.09 kg/m?. ?Weight: 88.5 kg (195 lb)  ?  ?GENERAL: Alert, active, oriented x3 ?  ?HEENT: Pupils equal reactive to light. Extraocular movements are intact. Sclera clear. Palpebral conjunctiva normal red color.Pharynx clear. ?  ?NECK: Supple with no palpable mass and no adenopathy. ?  ?LUNGS: Sound clear with no rales rhonchi or wheezes. ?  ?HEART: Regular rhythm S1 and S2 without murmur. ?  ?ABDOMEN: Soft and depressible, nontender with no palpable mass, no hepatomegaly.  Palpable umbilical hernia partially incarcerated over previous scar. ?  ?EXTREMITIES: Well-developed well-nourished symmetrical with no dependent edema. ?  ?NEUROLOGICAL: Awake alert oriented, facial expression symmetrical, moving all extremities. ?  ?REVIEW OF DATA: ?I have reviewed the following data today: ?     ?Initial consult on 12/07/2021  ?Component Date Value  ? Glucose 12/07/2021 99   ? Sodium 12/07/2021 139   ? Potassium 12/07/2021 4.6   ? Chloride 12/07/2021 104   ? Carbon Dioxide (CO2)  12/07/2021 29.2   ? Urea Nitrogen (BUN) 12/07/2021 18   ? Creatinine 12/07/2021 1.1   ? Glomerular Filtration Ra* 12/07/2021 67   ? Calcium 12/07/2021 9.3   ? AST  12/07/2021 25   ? ALT  12/07/2021 28   ? Alk Phos (alkaline Phosp* 12/07/2021 68   ? Albumin 12/07/2021 4.3   ? Bilirubin, Total 12/07/2021 1.1   ? Protein, Total 12/07/2021 6.4   ?   A/G Ratio 12/07/2021 2.0   ? WBC (White Blood Cell Co* 12/07/2021 5.2   ? RBC (Red Blood Cell Coun* 12/07/2021 4.87   ? Hemoglobin 12/07/2021 15.4   ? Hematocrit 12/07/2021 45.5   ? MCV (Mean Corpuscular Vo* 12/07/2021 93.4   ? MCH (Mean Corpuscular He* 12/07/2021 31.6 (H)   ? MCHC (Mean Corpuscular H* 12/07/2021 33.8   ? Platelet Count 12/07/2021 232   ? RDW-CV (Red Cell Distrib* 12/07/2021 12.6   ? MPV (Mean Platelet Volum* 12/07/2021 8.9 (L)   ? Neutrophils 12/07/2021 3.19   ? Lymphocytes 12/07/2021 1.09   ? Monocytes 12/07/2021 0.82   ? Eosinophils 12/07/2021 0.06   ? Basophils 12/07/2021 0.02   ? Neutrophil % 12/07/2021 61.4   ? Lymphocyte % 12/07/2021 21.0   ? Monocyte % 12/07/2021 15.8 (H)   ? Eosinophil % 12/07/2021 1.2   ? Basophil% 12/07/2021 0.4   ? Immature Granulocyte % 12/07/2021 0.2   ? Immature Granulocyte Cou* 12/07/2021 0.01   ?  ?  ?ASSESSMENT: ?Mr. Bernales is a 70 y.o. male presenting for consultation for umbilical hernia.   ?  ?The patient presents with a symptomatic umbilical hernia. Patient was oriented about the diagnosis of umbilical hernia and its implication. The patient was oriented about the treatment alternatives (observation vs surgical repair). Due to patient symptoms, repair is recommended. Patient oriented about the surgical procedure, the use of mesh and its risk of complications such as: infection, bleeding, injury to vasculature, injury to bowel or bladder, and chronic pain, intestinal obstruction, among others.  ?  ?Since this is a recurrent hernia from a previous open surgery I would recommend to proceed with robotic assisted laparoscopic  umbilical hernia repair. ?  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  ?PLAN: ?Robotic assisted laparoscopic recurrent umbilical hernia repair (49616) ?CBC, CMP ?Avoid taking aspirin 5 days bef

## 2021-12-07 NOTE — H&P (Signed)
PATIENT PROFILE: ?Richard Davies is a 70 y.o. male who presents to the Clinic for consultation at the request of Dr. Whitaker for evaluation of umbilical hernia. ?  ?PCP:  Linthavong, Kanhka, MD ?  ?HISTORY OF PRESENT ILLNESS: ?Richard Davies reports he has been feeling umbilical hernia since 2 weeks ago.  Initially when he came out it was tender to palpation but he has been getting softer and less painful.  He denies any episode of abdominal distention nausea and vomiting.  Pain localized to the paramedical area.  There is no radiation.  Aggravating factor is applying pressure.  There is no alleviating factors. ?  ?Patient endorses he had surgery for an umbilical hernia about 10 years ago.  He cannot recall if he had mesh placed.  He endorses that he was in an emergent basis. ?  ?  ?PROBLEM LIST: ?       ?Problem List  Date Reviewed: 07/13/2021  ?        Noted  ?  OSA (obstructive sleep apnea)- Sleep study 09/08/21 (severe) 09/14/2021  ?  Medicare annual wellness visit, initial 07/13/21 07/13/2021  ?  DNR (do not resuscitate) 07/13/2021  ?  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  Status post total right knee replacement 05/17/2017  ?  Pure hypercholesterolemia (LDL 98 - 07/05/21)  11/02/2016  ?  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  S/P rotator cuff repair 01/15/2014  ?  ?  ?GENERAL REVIEW OF SYSTEMS:  ?  ?General ROS: negative for - chills, fatigue, fever, weight gain or weight loss ?Allergy and Immunology ROS: negative for - hives  ?Hematological and Lymphatic ROS: negative for - bleeding problems or bruising, negative for palpable nodes ?Endocrine ROS: negative for - heat or cold intolerance, hair changes ?Respiratory ROS: negative for - cough, shortness of breath or wheezing ?Cardiovascular ROS: no chest pain or palpitations ?GI ROS: negative for nausea, vomiting, abdominal pain, diarrhea, constipation ?Musculoskeletal ROS: negative for - joint swelling or muscle  pain ?Neurological ROS: negative for - confusion, syncope ?Dermatological ROS: negative for pruritus and rash ?Psychiatric: negative for anxiety, depression, difficulty sleeping and memory loss ?  ?MEDICATIONS: ?Current Medications  ?      ?Current Outpatient Medications  ?Medication Sig Dispense Refill  ? amoxicillin (AMOXIL) 500 MG capsule Take 4 capsules 1 hour before the dental procedure 4 capsule 2  ? cetirizine (ZYRTEC) 10 MG tablet Take 1 tablet (10 mg total) by mouth once daily      ? clindamycin (CLEOCIN) 300 MG capsule TAKE 2 CAPSULES (600 MG TOTAL) BY MOUTH ONCE FOR 1 DOSE ONE HOUR BEFORE DENTAL APPT.      ? lovastatin (MEVACOR) 40 MG tablet TAKE 1 TABLET BY MOUTH EVERY DAY WITH DINNER 90 tablet 1  ? multivit-min-FA-lycopen-lutein (CENTRUM SILVER ULTRA MEN'S) 300-600-300 mcg Tab Take 1 tablet by mouth once daily      ? omeprazole (PRILOSEC) 20 MG DR capsule Take 20 mg by mouth once daily      ? sildenafil (REVATIO) 20 mg tablet 3-5 tablets po daily as needed      ?  ?No current facility-administered medications for this visit.  ?  ?  ?  ?ALLERGIES: ?Ciprofloxacin, Codeine, and Penicillins ?  ?PAST MEDICAL HISTORY: ?    ?Past Medical History:  ?Diagnosis Date  ? Environmental allergies    ? History of hernia repair    ?  ?  ?PAST SURGICAL HISTORY: ?     ?  Past Surgical History:  ?Procedure Laterality Date  ? Foot surgery   2009  ? INCISION & DRAINAGE ABSCESS RECTAL   2011  ? COLONOSCOPY   07/03/2010  ?  Dr. M. Skulskie @ [facility] - Adenomatous Polyp, rpt 5 yrs per [facility], ltr mailed  ? REPAIR ROTATOR CUFF TEAR CHRONIC OPEN Right 01/08/14  ? Limited arthroscopic debridement, arthroscopic subacromial decompression and mini-open rotator cuff repair left shoulder  Left 04/05/2016  ?  Dr.Poggi  ? Right total knee arthroplasty using computer-assisted navigation   04/01/2017  ?  Dr Hooten  ? COLONOSCOPY   07/02/2017  ?  Diverticulosis/PHx CP repeat 5 yr. [facility]   ? Left total knee arthroplasty using computer-assisted  navigation   06/01/2019  ?  Dr Hooten  ? HERNIA REPAIR   1980;2013  ?  ?  ?FAMILY HISTORY: ?     ?Family History  ?Problem Relation Age of Onset  ? Prostate cancer Father    ? Brain cancer Brother    ? Prostate cancer Brother    ?  ?  ?SOCIAL HISTORY: ?Social History  ?  ?  ?     ?Socioeconomic History  ? Marital status: Divorced  ? Number of children: 1  ? Years of education: 14  ?Occupational History  ? Occupation: Retired-Working Part-time Carpenter  ?    Comment: self  ?Tobacco Use  ? Smoking status: Never  ? Smokeless tobacco: Former  ?    Types: Chew  ?    Quit date: 12/31/1993  ?Vaping Use  ? Vaping Use: Never used  ?Substance and Sexual Activity  ? Alcohol use: Yes  ?    Alcohol/week: 12.0 standard drinks  ?    Types: 12 Cans of beer per week  ?    Comment: 12 beers per week  ? Drug use: No  ? Sexual activity: Defer  ?    Partners: Female  ?  ?  ?  ?PHYSICAL EXAM: ?   ?Vitals:  ?  12/07/21 1018  ?BP: (!) 147/92  ?Pulse: 71  ?  ?Body mass index is 30.09 kg/m?. ?Weight: 88.5 kg (195 lb)  ?  ?GENERAL: Alert, active, oriented x3 ?  ?HEENT: Pupils equal reactive to light. Extraocular movements are intact. Sclera clear. Palpebral conjunctiva normal red color.Pharynx clear. ?  ?NECK: Supple with no palpable mass and no adenopathy. ?  ?LUNGS: Sound clear with no rales rhonchi or wheezes. ?  ?HEART: Regular rhythm S1 and S2 without murmur. ?  ?ABDOMEN: Soft and depressible, nontender with no palpable mass, no hepatomegaly.  Palpable umbilical hernia partially incarcerated over previous scar. ?  ?EXTREMITIES: Well-developed well-nourished symmetrical with no dependent edema. ?  ?NEUROLOGICAL: Awake alert oriented, facial expression symmetrical, moving all extremities. ?  ?REVIEW OF DATA: ?I have reviewed the following data today: ?     ?Initial consult on 12/07/2021  ?Component Date Value  ? Glucose 12/07/2021 99   ? Sodium 12/07/2021 139   ? Potassium 12/07/2021 4.6   ? Chloride 12/07/2021 104   ? Carbon Dioxide (CO2)  12/07/2021 29.2   ? Urea Nitrogen (BUN) 12/07/2021 18   ? Creatinine 12/07/2021 1.1   ? Glomerular Filtration Ra* 12/07/2021 67   ? Calcium 12/07/2021 9.3   ? AST  12/07/2021 25   ? ALT  12/07/2021 28   ? Alk Phos (alkaline Phosp* 12/07/2021 68   ? Albumin 12/07/2021 4.3   ? Bilirubin, Total 12/07/2021 1.1   ? Protein, Total 12/07/2021 6.4   ?   A/G Ratio 12/07/2021 2.0   ? WBC (White Blood Cell Co* 12/07/2021 5.2   ? RBC (Red Blood Cell Coun* 12/07/2021 4.87   ? Hemoglobin 12/07/2021 15.4   ? Hematocrit 12/07/2021 45.5   ? MCV (Mean Corpuscular Vo* 12/07/2021 93.4   ? MCH (Mean Corpuscular He* 12/07/2021 31.6 (H)   ? MCHC (Mean Corpuscular H* 12/07/2021 33.8   ? Platelet Count 12/07/2021 232   ? RDW-CV (Red Cell Distrib* 12/07/2021 12.6   ? MPV (Mean Platelet Volum* 12/07/2021 8.9 (L)   ? Neutrophils 12/07/2021 3.19   ? Lymphocytes 12/07/2021 1.09   ? Monocytes 12/07/2021 0.82   ? Eosinophils 12/07/2021 0.06   ? Basophils 12/07/2021 0.02   ? Neutrophil % 12/07/2021 61.4   ? Lymphocyte % 12/07/2021 21.0   ? Monocyte % 12/07/2021 15.8 (H)   ? Eosinophil % 12/07/2021 1.2   ? Basophil% 12/07/2021 0.4   ? Immature Granulocyte % 12/07/2021 0.2   ? Immature Granulocyte Cou* 12/07/2021 0.01   ?  ?  ?ASSESSMENT: ?Richard Davies is a 70 y.o. male presenting for consultation for umbilical hernia.   ?  ?The patient presents with a symptomatic umbilical hernia. Patient was oriented about the diagnosis of umbilical hernia and its implication. The patient was oriented about the treatment alternatives (observation vs surgical repair). Due to patient symptoms, repair is recommended. Patient oriented about the surgical procedure, the use of mesh and its risk of complications such as: infection, bleeding, injury to vasculature, injury to bowel or bladder, and chronic pain, intestinal obstruction, among others.  ?  ?Since this is a recurrent hernia from a previous open surgery I would recommend to proceed with robotic assisted laparoscopic  umbilical hernia repair. ?  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  ?PLAN: ?Robotic assisted laparoscopic recurrent umbilical hernia repair (49616) ?CBC, CMP ?Avoid taking aspirin 5 days bef

## 2021-12-13 ENCOUNTER — Encounter
Admission: RE | Admit: 2021-12-13 | Discharge: 2021-12-13 | Disposition: A | Discharge status: Home / Self Care | Payer: Medicare HMO | Source: Ambulatory Visit | Attending: General Surgery | Admitting: General Surgery

## 2021-12-13 VITALS — Ht 69.0 in | Wt 190.0 lb

## 2021-12-13 DIAGNOSIS — Z01812 Encounter for preprocedural laboratory examination: Secondary | ICD-10-CM

## 2021-12-13 DIAGNOSIS — Z01810 Encounter for preprocedural cardiovascular examination: Secondary | ICD-10-CM | POA: Diagnosis not present

## 2021-12-13 DIAGNOSIS — R54 Age-related physical debility: Secondary | ICD-10-CM | POA: Insufficient documentation

## 2021-12-13 HISTORY — DX: Umbilical hernia without obstruction or gangrene: K42.9

## 2021-12-13 HISTORY — DX: Gastro-esophageal reflux disease without esophagitis: K21.9

## 2021-12-13 HISTORY — DX: Pure hypercholesterolemia, unspecified: E78.00

## 2021-12-13 HISTORY — DX: Prediabetes: R73.03

## 2021-12-13 NOTE — Patient Instructions (Addendum)
Your procedure is scheduled on: Tuesday, May 9 ?Report to the Registration Desk on the 1st floor of the [facility]. ?To find out your arrival time, please call [phone] between 1PM - 3PM on: Monday, May 8 ?If your arrival time is 6:00 am, do not arrive prior to that time as the [facility] entrance doors do not open until 6:00 am. ? ?REMEMBER: ?Instructions that are not followed completely may result in serious medical risk, up to and including death; or upon the discretion of your surgeon and anesthesiologist your surgery may need to be rescheduled. ? ?Do not eat or drink after midnight the night before surgery.  ?No gum chewing, lozengers or hard candies. ? ?TAKE THESE MEDICATIONS THE MORNING OF SURGERY WITH A SIP OF WATER: ? ?Lovastatin ?Omeprazole (Prilosec) - (take one the night before and one on the morning of surgery - helps to prevent nausea after surgery.) ? ?One week prior to surgery: ?Stop Anti-inflammatories (NSAIDS) such as Advil, Aleve, Ibuprofen, Motrin, Naproxen, Naprosyn and Aspirin based products such as Excedrin, Goodys Powder, BC Powder. ?Stop ANY OVER THE COUNTER supplements until after surgery. Stop multiple vitamins ?You may however, continue to take Tylenol if needed for pain up until the day of surgery. ? ?No Alcohol for 24 hours before or after surgery. ? ?No Smoking including e-cigarettes for 24 hours prior to surgery.  ?No chewable tobacco products for at least 6 hours prior to surgery.  ?No nicotine patches on the day of surgery. ? ?Do not use any "recreational" drugs for at least a week prior to your surgery.  ?Please be advised that the combination of cocaine and anesthesia may have negative outcomes, up to and including death. ?If you test positive for cocaine, your surgery will be cancelled. ? ?On the morning of surgery brush your teeth with toothpaste and water, you may rinse your mouth with mouthwash if you wish. ?Do not swallow any toothpaste or mouthwash. ? ?Use CHG Soap  as directed on instruction sheet. ? ?Do not wear jewelry, make-up, hairpins, clips or nail polish. ? ?Do not wear lotions, powders, or perfumes.  ? ?Do not shave body from the neck down 48 hours prior to surgery just in case you cut yourself which could leave a site for infection.  ?Also, freshly shaved skin may become irritated if using the CHG soap. ? ?Contact lenses, hearing aids and dentures may not be worn into surgery. ? ?Do not bring valuables to the hospital. [facility] is not responsible for any missing/lost belongings or valuables.  ? ?Notify your doctor if there is any change in your medical condition (cold, fever, infection). ? ?Wear comfortable clothing (specific to your surgery type) to the hospital. ? ?After surgery, you can help prevent lung complications by doing breathing exercises.  ?Take deep breaths and cough every 1-2 hours. Your doctor may order a device called an Incentive Spirometer to help you take deep breaths. ?When coughing or sneezing, hold a pillow firmly against your incision with both hands. This is called ?splinting.? Doing this helps protect your incision. It also decreases belly discomfort. ? ?If you are being discharged the day of surgery, you will not be allowed to drive home. ?You will need a responsible adult (18 years or older) to drive you home and stay with you that night.  ? ?If you are taking public transportation, you will need to have a responsible adult (18 years or older) with you. ?Please confirm with your physician that it is  acceptable to use public transportation.  ? ?Please call the Pre-admissions Testing Dept. at [phone] if you have any questions about these instructions. ? ?Surgery Visitation Policy: ? ?Patients undergoing a surgery or procedure may have two family members or support persons with them as long as the person is not COVID-19 positive or experiencing its symptoms.  ?

## 2021-12-19 ENCOUNTER — Ambulatory Visit
Admission: RE | Admit: 2021-12-19 | Discharge: 2021-12-19 | Disposition: A | Discharge status: Home / Self Care | Payer: Medicare HMO | Attending: General Surgery | Admitting: General Surgery

## 2021-12-19 ENCOUNTER — Encounter
Admission: RE | Disposition: A | Discharge status: Home / Self Care | Payer: Self-pay | Source: Home / Self Care | Attending: General Surgery

## 2021-12-19 ENCOUNTER — Ambulatory Visit: Payer: Medicare HMO | Admitting: Anesthesiology

## 2021-12-19 ENCOUNTER — Other Ambulatory Visit: Payer: Self-pay

## 2021-12-19 ENCOUNTER — Encounter: Payer: Self-pay | Admitting: General Surgery

## 2021-12-19 DIAGNOSIS — Z87891 Personal history of nicotine dependence: Secondary | ICD-10-CM | POA: Insufficient documentation

## 2021-12-19 DIAGNOSIS — G4730 Sleep apnea, unspecified: Secondary | ICD-10-CM | POA: Diagnosis not present

## 2021-12-19 DIAGNOSIS — K219 Gastro-esophageal reflux disease without esophagitis: Secondary | ICD-10-CM | POA: Diagnosis not present

## 2021-12-19 DIAGNOSIS — K420 Umbilical hernia with obstruction, without gangrene: Secondary | ICD-10-CM | POA: Insufficient documentation

## 2021-12-19 SURGERY — REPAIR, HERNIA, UMBILICAL, ROBOT-ASSISTED
Anesthesia: General | Site: Abdomen

## 2021-12-19 MED ORDER — CEFAZOLIN SODIUM-DEXTROSE 2-4 GM/100ML-% IV SOLN
2.0000 g | INTRAVENOUS | Status: AC
  Administered 2021-12-19: 2 g via INTRAVENOUS

## 2021-12-19 MED ORDER — PROPOFOL 10 MG/ML IV BOLUS
INTRAVENOUS | Status: AC
  Filled 2021-12-19: qty 20

## 2021-12-19 MED ORDER — ROCURONIUM BROMIDE 10 MG/ML (PF) SYRINGE
PREFILLED_SYRINGE | INTRAVENOUS | Status: AC
  Filled 2021-12-19: qty 10

## 2021-12-19 MED ORDER — OXYCODONE HCL 5 MG PO TABS
ORAL_TABLET | ORAL | Status: AC
  Filled 2021-12-19: qty 1

## 2021-12-19 MED ORDER — ACETAMINOPHEN 10 MG/ML IV SOLN: 1000.0000 mg | Freq: Once | INTRAVENOUS | Status: DC | PRN

## 2021-12-19 MED ORDER — PROPOFOL 10 MG/ML IV BOLUS
INTRAVENOUS | Status: DC | PRN
  Administered 2021-12-19: 160 mg via INTRAVENOUS

## 2021-12-19 MED ORDER — ORAL CARE MOUTH RINSE: 15.0000 mL | Freq: Once | OROMUCOSAL | Status: AC

## 2021-12-19 MED ORDER — ONDANSETRON HCL 4 MG/2ML IJ SOLN
INTRAMUSCULAR | Status: DC | PRN
  Administered 2021-12-19: 4 mg via INTRAVENOUS

## 2021-12-19 MED ORDER — MIDAZOLAM HCL 2 MG/2ML IJ SOLN
INTRAMUSCULAR | Status: AC
  Filled 2021-12-19: qty 2

## 2021-12-19 MED ORDER — EPHEDRINE SULFATE (PRESSORS) 50 MG/ML IJ SOLN
INTRAMUSCULAR | Status: DC | PRN
  Administered 2021-12-19: 10 mg via INTRAVENOUS
  Administered 2021-12-19: 2.5 mg via INTRAVENOUS

## 2021-12-19 MED ORDER — FENTANYL CITRATE (PF) 100 MCG/2ML IJ SOLN: 25.0000 ug | INTRAMUSCULAR | Status: DC | PRN

## 2021-12-19 MED ORDER — MIDAZOLAM HCL 2 MG/2ML IJ SOLN
INTRAMUSCULAR | Status: DC | PRN
  Administered 2021-12-19: 2 mg via INTRAVENOUS

## 2021-12-19 MED ORDER — KETAMINE HCL 10 MG/ML IJ SOLN
INTRAMUSCULAR | Status: DC | PRN
  Administered 2021-12-19: 30 mg via INTRAVENOUS

## 2021-12-19 MED ORDER — LACTATED RINGERS IV SOLN: INTRAVENOUS | Status: DC

## 2021-12-19 MED ORDER — SUGAMMADEX SODIUM 200 MG/2ML IV SOLN
INTRAVENOUS | Status: DC | PRN
Start: 2021-12-19 — End: 2021-12-19
  Administered 2021-12-19: 175 mg via INTRAVENOUS

## 2021-12-19 MED ORDER — OXYCODONE HCL 5 MG/5ML PO SOLN: 5.0000 mg | Freq: Once | ORAL | Status: AC | PRN

## 2021-12-19 MED ORDER — KETOROLAC TROMETHAMINE 30 MG/ML IJ SOLN
INTRAMUSCULAR | Status: DC | PRN
  Administered 2021-12-19: 30 mg via INTRAVENOUS

## 2021-12-19 MED ORDER — BUPIVACAINE-EPINEPHRINE (PF) 0.25% -1:200000 IJ SOLN
INTRAMUSCULAR | Status: AC
  Filled 2021-12-19: qty 30

## 2021-12-19 MED ORDER — ONDANSETRON HCL 4 MG/2ML IJ SOLN
INTRAMUSCULAR | Status: AC
Start: 2021-12-19 — End: ?
  Filled 2021-12-19: qty 2

## 2021-12-19 MED ORDER — STERILE WATER FOR IRRIGATION IR SOLN
Status: DC | PRN
  Administered 2021-12-19: 150 mL

## 2021-12-19 MED ORDER — OXYCODONE-ACETAMINOPHEN 5-325 MG PO TABS: 1.0000 | ORAL_TABLET | ORAL | 0 refills | Status: AC | PRN

## 2021-12-19 MED ORDER — DROPERIDOL 2.5 MG/ML IJ SOLN: 0.6250 mg | Freq: Once | INTRAMUSCULAR | Status: DC | PRN

## 2021-12-19 MED ORDER — ACETAMINOPHEN 10 MG/ML IV SOLN
INTRAVENOUS | Status: DC | PRN
  Administered 2021-12-19: 1000 mg via INTRAVENOUS

## 2021-12-19 MED ORDER — OXYCODONE HCL 5 MG PO TABS
5.0000 mg | ORAL_TABLET | Freq: Once | ORAL | Status: AC | PRN
  Administered 2021-12-19: 5 mg via ORAL

## 2021-12-19 MED ORDER — DEXAMETHASONE SODIUM PHOSPHATE 10 MG/ML IJ SOLN
INTRAMUSCULAR | Status: DC | PRN
  Administered 2021-12-19: 10 mg via INTRAVENOUS

## 2021-12-19 MED ORDER — ACETAMINOPHEN 10 MG/ML IV SOLN
INTRAVENOUS | Status: AC
  Filled 2021-12-19: qty 100

## 2021-12-19 MED ORDER — FENTANYL CITRATE (PF) 100 MCG/2ML IJ SOLN
INTRAMUSCULAR | Status: DC | PRN
  Administered 2021-12-19: 150 ug via INTRAVENOUS
  Administered 2021-12-19: 100 ug via INTRAVENOUS

## 2021-12-19 MED ORDER — CHLORHEXIDINE GLUCONATE 0.12 % MT SOLN
OROMUCOSAL | Status: AC
  Administered 2021-12-19: 15 mL via OROMUCOSAL
  Filled 2021-12-19: qty 15

## 2021-12-19 MED ORDER — CEFAZOLIN SODIUM-DEXTROSE 2-4 GM/100ML-% IV SOLN
INTRAVENOUS | Status: AC
  Filled 2021-12-19: qty 100

## 2021-12-19 MED ORDER — KETAMINE HCL 50 MG/5ML IJ SOSY
PREFILLED_SYRINGE | INTRAMUSCULAR | Status: AC
  Filled 2021-12-19: qty 5

## 2021-12-19 MED ORDER — PROMETHAZINE HCL 25 MG/ML IJ SOLN: 6.2500 mg | INTRAMUSCULAR | Status: DC | PRN

## 2021-12-19 MED ORDER — LIDOCAINE HCL (PF) 2 % IJ SOLN
INTRAMUSCULAR | Status: AC
  Filled 2021-12-19: qty 5

## 2021-12-19 MED ORDER — DEXAMETHASONE SODIUM PHOSPHATE 10 MG/ML IJ SOLN
INTRAMUSCULAR | Status: AC
  Filled 2021-12-19: qty 1

## 2021-12-19 MED ORDER — ROCURONIUM BROMIDE 100 MG/10ML IV SOLN
INTRAVENOUS | Status: DC | PRN
  Administered 2021-12-19: 60 mg via INTRAVENOUS

## 2021-12-19 MED ORDER — FENTANYL CITRATE (PF) 250 MCG/5ML IJ SOLN
INTRAMUSCULAR | Status: AC
  Filled 2021-12-19: qty 5

## 2021-12-19 MED ORDER — CHLORHEXIDINE GLUCONATE 0.12 % MT SOLN: 15.0000 mL | Freq: Once | OROMUCOSAL | Status: AC

## 2021-12-19 MED ORDER — BUPIVACAINE-EPINEPHRINE (PF) 0.25% -1:200000 IJ SOLN
INTRAMUSCULAR | Status: DC | PRN
  Administered 2021-12-19: 30 mL via PERINEURAL

## 2021-12-19 MED ORDER — LIDOCAINE HCL (CARDIAC) PF 100 MG/5ML IV SOSY
PREFILLED_SYRINGE | INTRAVENOUS | Status: DC | PRN
  Administered 2021-12-19: 100 mg via INTRAVENOUS

## 2021-12-19 MED ORDER — KETOROLAC TROMETHAMINE 30 MG/ML IJ SOLN
INTRAMUSCULAR | Status: AC
  Filled 2021-12-19: qty 1

## 2021-12-19 MED ORDER — EPHEDRINE 5 MG/ML INJ
INTRAVENOUS | Status: AC
Start: 2021-12-19 — End: ?
  Filled 2021-12-19: qty 5

## 2021-12-19 SURGICAL SUPPLY — 44 items
BLADE SURG SZ11 CARB STEEL (BLADE) ×2 IMPLANT
COVER TIP SHEARS 8 DVNC (MISCELLANEOUS) ×1 IMPLANT
COVER TIP SHEARS 8MM DA VINCI (MISCELLANEOUS) ×1
COVER WAND RF STERILE (DRAPES) ×1 IMPLANT
DERMABOND ADVANCED (GAUZE/BANDAGES/DRESSINGS) ×1
DERMABOND ADVANCED .7 DNX12 (GAUZE/BANDAGES/DRESSINGS) ×1 IMPLANT
DRAPE ARM DVNC X/XI (DISPOSABLE) ×3 IMPLANT
DRAPE COLUMN DVNC XI (DISPOSABLE) ×1 IMPLANT
DRAPE DA VINCI XI ARM (DISPOSABLE) ×3
DRAPE DA VINCI XI COLUMN (DISPOSABLE) ×1
ELECT REM PT RETURN 9FT ADLT (ELECTROSURGICAL) ×2
ELECTRODE REM PT RTRN 9FT ADLT (ELECTROSURGICAL) ×1 IMPLANT
GLOVE BIO SURGEON STRL SZ 6.5 (GLOVE) ×4 IMPLANT
GLOVE BIOGEL PI IND STRL 6.5 (GLOVE) ×2 IMPLANT
GLOVE BIOGEL PI INDICATOR 6.5 (GLOVE) ×2
GOWN STRL REUS W/ TWL LRG LVL3 (GOWN DISPOSABLE) ×3 IMPLANT
GOWN STRL REUS W/TWL LRG LVL3 (GOWN DISPOSABLE) ×3
IV CATH ANGIO 12GX3 LT BLUE (NEEDLE) ×1 IMPLANT
KIT IMAGING PINPOINTPAQ (MISCELLANEOUS) ×1 IMPLANT
KIT PINK PAD W/HEAD ARE REST (MISCELLANEOUS) ×2
KIT PINK PAD W/HEAD ARM REST (MISCELLANEOUS) ×1 IMPLANT
LABEL OR SOLS (LABEL) ×2 IMPLANT
MANIFOLD NEPTUNE II (INSTRUMENTS) ×2 IMPLANT
MESH VENTRALIGHT ST 4.5IN (Mesh General) ×1 IMPLANT
NDL INSUFFLATION 14GA 120MM (NEEDLE) ×1 IMPLANT
NEEDLE HYPO 22GX1.5 SAFETY (NEEDLE) ×2 IMPLANT
NEEDLE INSUFFLATION 14GA 120MM (NEEDLE) ×2 IMPLANT
NS IRRIG 500ML POUR BTL (IV SOLUTION) ×2 IMPLANT
OBTURATOR OPTICAL STANDARD 8MM (TROCAR) ×1
OBTURATOR OPTICAL STND 8 DVNC (TROCAR) ×1
OBTURATOR OPTICALSTD 8 DVNC (TROCAR) ×1 IMPLANT
PACK LAP CHOLECYSTECTOMY (MISCELLANEOUS) ×2 IMPLANT
SEAL CANN UNIV 5-8 DVNC XI (MISCELLANEOUS) ×3 IMPLANT
SEAL XI 5MM-8MM UNIVERSAL (MISCELLANEOUS) ×3
SET TUBE SMOKE EVAC HIGH FLOW (TUBING) ×2 IMPLANT
SOLUTION ELECTROLUBE (MISCELLANEOUS) ×2 IMPLANT
SUT MNCRL 4-0 (SUTURE) ×1
SUT MNCRL 4-0 27XMFL (SUTURE) ×1
SUT STRATAFIX PDS 30 CT-1 (SUTURE) ×2 IMPLANT
SUT VICRYL 0 AB UR-6 (SUTURE) ×2 IMPLANT
SUT VLOC 90 2/L VL 12 GS22 (SUTURE) ×3 IMPLANT
SUTURE MNCRL 4-0 27XMF (SUTURE) ×1 IMPLANT
TAPE TRANSPORE STRL 2 31045 (GAUZE/BANDAGES/DRESSINGS) ×1 IMPLANT
WATER STERILE IRR 500ML POUR (IV SOLUTION) ×2 IMPLANT

## 2021-12-19 NOTE — Anesthesia Preprocedure Evaluation (Addendum)
Anesthesia Evaluation  ?Patient identified by MRN, date of birth, ID band ?Patient awake ? ? ? ?Reviewed: ?Allergy & Precautions, H&P , NPO status , Patient's Chart, lab work & pertinent test results ? ?History of Anesthesia Complications ?(+) PROLONGED EMERGENCE and history of anesthetic complications ? ?Airway ?Mallampati: III ? ?TM Distance: >3 FB ?Neck ROM: full ? ? ? Dental ? ?(+) Teeth Intact ?  ?Pulmonary ?sleep apnea ,  ?  ?Pulmonary exam normal ? ? ? ? ? ? ? Cardiovascular ?Normal cardiovascular exam ? ? ?  ?Neuro/Psych ?negative neurological ROS ? negative psych ROS  ? GI/Hepatic ?Neg liver ROS, GERD  Medicated and Controlled,  ?Endo/Other  ?negative endocrine ROS ? Renal/GU ?  ? ?  ?Musculoskeletal ? ? Abdominal ?Normal abdominal exam  (+)   ?Peds ? 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    Comment:  NO CPAP ? ?Past Surgical History: ?07/02/2017: COLONOSCOPY WITH PROPOFOL; N/A ?    Comment:  Procedure: COLONOSCOPY WITH PROPOFOL;  Surgeon:  ?             Christena Deem, MD;  Location: [facility];   ?             Service: Endoscopy;  Laterality: N/A; ?2007: FOOT SURGERY ?    Comment:  R big toe shortened ?2012: HERNIA REPAIR ?    Comment:  umbilical ?No date: INCISION AND DRAINAGE ABSCESS ANAL ?2011: incision and drainage rectal abcess ?2008: INGUINAL HERNIA REPAIR; Left ?No date: JOINT REPLACEMENT ?    Comment:  right knee ?04/01/2017: KNEE ARTHROPLASTY; Right ?    Comment:  Procedure: COMPUTER ASSISTED TOTAL KNEE ARTHROPLASTY;   ?             Surgeon: Donato Heinz, MD;  Location: [facility] ORS;   ?             Service: Orthopedics;  Laterality: Right; ?2015: ROTATOR CUFF REPAIR; Right ?04/05/2016: SHOULDER ARTHROSCOPY  WITH OPEN ROTATOR CUFF REPAIR; Left ?    Comment:  Procedure: SHOULDER ARTHROSCOPY WITH OPEN ROTATOR CUFF  ?             REPAIR;  Surgeon: Christena Flake, MD;  Location: [facility] ORS;  ?             Service: Orthopedics;  Laterality: Left; ?04/05/2016: SHOULDER ARTHROSCOPY WITH SUBACROMIAL DECOMPRESSION; Left ?    Comment:  Procedure: SHOULDER ARTHROSCOPY WITH SUBACROMIAL  ?             DECOMPRESSION AND LIMITED DEBRIDMENT;  Surgeon: Excell Seltzer  ?             Poggi, MD;  Location: [facility] ORS;  Service: Orthopedics;   ?             Laterality: Left; ? ? ? ? Reproductive/Obstetrics ?negative OB ROS ? ?  ? ? ? ? ? ? ? ? ? ? ? ? ? ?  ?  ? ? ? ? ? ? ? ?Anesthesia Physical ? ?Anesthesia Plan ? ?ASA: II ? ?Anesthesia Plan: General  ? ?Post-op Pain Management: Celebrex PO (pre-op)*, Gabapentin PO (pre-op)* and Tylenol PO (pre-op)*  ? ?Induction: Intravenous ? ?PONV Risk Score and Plan: 3 and Ondansetron, Dexamethasone and Midazolam ? ?Airway Management  Planned: Oral ETT ? ?Additional Equipment:  ? ?Intra-op Plan:  ? ?Post-operative Plan: Extubation in OR ? ?Informed Consent: I have reviewed the patients History and Physical, chart, labs and discussed the procedure including the risks, benefits and alternatives for the proposed anesthesia with the patient or authorized representative who has indicated his/her understanding and acceptance.  ? ? ? ?Dental Advisory Given ? ?Plan Discussed with: Anesthesiologist, CRNA and Surgeon ? ?Anesthesia Plan Comments:   ? ? ? ? ? ?Anesthesia Quick Evaluation ? ?

## 2021-12-19 NOTE — Transfer of Care (Signed)
Immediate Anesthesia Transfer of Care Note ? ?Patient: Richard Davies ? ?Procedure(s) Performed: XI ROBOT ASSISTED UMBILICAL HERNIA REPAIR (Abdomen) ? ?Patient Location: PACU ? ?Anesthesia Type:General ? ?Level of Consciousness: awake, alert  and oriented ? ?Airway & Oxygen Therapy: Patient Spontanous Breathing and Patient connected to nasal cannula oxygen ? ?Post-op Assessment: Report given to RN, Post -op Vital signs reviewed and stable and Patient moving all extremities ? ?Post vital signs: Reviewed and stable ? ?Last Vitals:  ?Vitals Value Taken Time  ?BP 103/60 12/19/21 1013  ?Temp    ?Pulse 59 12/19/21 1013  ?Resp 12 12/19/21 1013  ?SpO2 93 % 12/19/21 1013  ? ? ?Last Pain:  ?Vitals:  ? 12/19/21 0631  ?TempSrc: Temporal  ?PainSc: 0-No pain  ?   ? ?  ? ?Complications: No notable events documented. ?

## 2021-12-19 NOTE — Anesthesia Procedure Notes (Signed)
Procedure Name: Intubation ?Date/Time: 12/19/2021 8:32 AM ?Performed by: Esaw Grandchild, CRNA ?Pre-anesthesia Checklist: Patient identified, Emergency Drugs available, Suction available and Patient being monitored ?Patient Re-evaluated:Patient Re-evaluated prior to induction ?Oxygen Delivery Method: Circle system utilized ?Preoxygenation: Pre-oxygenation with 100% oxygen ?Induction Type: IV induction ?Ventilation: Mask ventilation without difficulty and Oral airway inserted - appropriate to patient size ?Laryngoscope Size: Sabra Heck and 2 ?Grade View: Grade I ?Tube type: Oral ?Tube size: 7.5 mm ?Number of attempts: 1 ?Airway Equipment and Method: Stylet, Oral airway and Bite block ?Placement Confirmation: ETT inserted through vocal cords under direct vision, positive ETCO2 and breath sounds checked- equal and bilateral ?Secured at: 22 cm ?Tube secured with: Tape ?Dental Injury: Teeth and Oropharynx as per pre-operative assessment  ? ? ? ? ?

## 2021-12-19 NOTE — Interval H&P Note (Signed)
History and Physical Interval Note: ? ?12/19/2021 ?6:57 AM ? ?Richard Davies  has presented today for surgery, with the diagnosis of K42.0 Recurrent umbilical hernia w/ incarceration.  The various methods of treatment have been discussed with the patient and family. After consideration of risks, benefits and other options for treatment, the patient has consented to  Procedure(s): ?XI ROBOT ASSISTED UMBILICAL HERNIA REPAIR (N/A) as a surgical intervention.  The patient's history has been reviewed, patient examined, no change in status, stable for surgery.  I have reviewed the patient's chart and labs.  Questions were answered to the patient's satisfaction.   ? ? ?Richard Davies ? ? ?

## 2021-12-19 NOTE — Op Note (Signed)
Preoperative diagnosis: Umbilical hernia ? ?Postoperative diagnosis: Incarcerated recurrent umbilical hernia ? ?Procedure: Robotic assisted laparoscopic umbilical hernia repair with mesh ? ?Anesthesia: General ? ?Surgeon: Carolan Shiver, MD, FACS ? ?Wound Classification: Clean ? ?Specimen: None  ? ?Complications: None ? ?Estimated Blood Loss: 5 mL ? ?Indications: A 71 year old male with symptomatic recurrent umbilical hernia. Repair indicated to improve pain and avoid complications such as incarceration or strangulation.  ? ?Findings: ?Multiple 1 cm umbilical hernia.  In combination they measure 4 cm from one side to the other.  2 of them had incarcerated fat.  ?2. Tension free repair achieved with 11.4 centimeters bard mesh and suture ?3. Adequate hemostasis ? ? ? ? ? ? ? ? ?Description of procedure: The patient was brought to the operating room and general anesthesia was induced. A time-out was completed verifying correct patient, procedure, site, positioning, and implant(s) and/or special equipment prior to beginning this procedure. Antibiotics were administered prior to making the incision. SCDs placed. The anterior abdominal wall was prepped and draped in the standard sterile fashion.  ? ?Palmer's point chosen for entry.  Veress needle placed and abdomen insufflated to 15cm without any dramatic increase in pressure.  Needle removed and optiview technique used to place 8 mm port at same point.  No injury noted during placement. 2 additional ports were placed along left lateral aspect.  Xi robot then docked into place.  Hernia contents noted and reduced with combination of blunt, sharp dissection with scissors and fenestrated forceps.  Hemostasis achieved throughout this portion.  Once all hernia contents reduced, there was noted to be a multiple 1 cm recurrent umbilical hernia with incarcerated fat.   ? ?Insufflation dropped to 64mm and transfacial suture with 0 stratafix used to primarily close defect  under minimal tension. Bard protected 11.4 cm mesh was placed within the abdominal cavity through the port and secured to the abdominal wall centered over the defect using the 0 stratafix previously used to primarily close defect.  The mesh was then circumferentially sutured into the anterior abdominal wall using 2-0 VLock x2.  Any bleeding noted during this portion was no longer actively bleeding by end of securing mesh and tightening the suture.   ? ?Robot was undocked.  Abdomen then desufflated while camera within abdomen to ensure no signs of new bleed prior to removing camera and rest of ports completely.  All skin incisions closed with runninrg 4-0 Monocryl in a subcuticular fashion.  All wounds then dressed with Dermabond. ? ?Patient was then successfully awakened and transferred to PACU in stable condition.  At the end of the procedure sponge and instrument counts were correct. ? ?

## 2021-12-19 NOTE — Discharge Instructions (Addendum)
?  Diet: Resume home heart healthy regular diet.  ? ?Activity: No heavy lifting >20 pounds (children, pets, laundry, garbage) or strenuous activity until follow-up, but light activity and walking are encouraged. Do not drive or drink alcohol if taking narcotic pain medications. ? ?Wound care: May shower with soapy water and pat dry (do not rub incisions), but no baths or submerging incision underwater until follow-up. (no swimming)  ? ?Medications: Resume all home medications. For mild to moderate pain: acetaminophen (Tylenol) or ibuprofen (if no kidney disease). Combining Tylenol with alcohol can substantially increase your risk of causing liver disease. Narcotic pain medications, if prescribed, can be used for severe pain, though may cause nausea, constipation, and drowsiness. If you do not need the narcotic pain medication, you do not need to fill the prescription. ? ?Call office [phone]) at any time if any questions, worsening pain, fevers/chills, bleeding, drainage from incision site, or other concerns. ? ? ? ? ?AMBULATORY SURGERY  ?DISCHARGE INSTRUCTIONS ? ? ?The drugs that you were given will stay in your system until tomorrow so for the next 24 hours you should not: ? ?Drive an automobile ?Make any legal decisions ?Drink any alcoholic beverage ? ? ?You may resume regular meals tomorrow.  Today it is better to start with liquids and gradually work up to solid foods. ? ?You may eat anything you prefer, but it is better to start with liquids, then soup and crackers, and gradually work up to solid foods. ? ? ?Please notify your doctor immediately if you have any unusual bleeding, trouble breathing, redness and pain at the surgery site, drainage, fever, or pain not relieved by medication. ?Alternate 500mg  of tylenol with 800 mg  Ibuprofen one or the other every 4 hours ?  ? ? ? ?Additional Instructions:  ?[phone] ? ? ? ? ? ? ?

## 2021-12-20 NOTE — Anesthesia Postprocedure Evaluation (Signed)
Anesthesia Post Note ? ?Patient: Richard Davies ? ?Procedure(s) Performed: XI ROBOT ASSISTED UMBILICAL HERNIA REPAIR (Abdomen) ? ?Patient location during evaluation: PACU ?Anesthesia Type: General ?Level of consciousness: awake and alert ?Pain management: pain level controlled ?Vital Signs Assessment: post-procedure vital signs reviewed and stable ?Respiratory status: spontaneous breathing, nonlabored ventilation and respiratory function stable ?Cardiovascular status: blood pressure returned to baseline and stable ?Postop Assessment: no apparent nausea or vomiting ?Anesthetic complications: no ? ? ?No notable events documented. ? ? ?Last Vitals:  ?Vitals:  ? 12/19/21 1125 12/19/21 1155  ?BP: 110/73 118/70  ?Pulse: (!) 54 (!) 57  ?Resp: 16 16  ?Temp: (!) 36.2 ?C   ?SpO2: 100% 98%  ?  ?Last Pain:  ?Vitals:  ? 12/19/21 1125  ?TempSrc: Temporal  ?PainSc: 2   ? ? ?  ?  ?  ?  ?  ?  ? ?Foye Deer ? ? ? ? ?

## 2022-07-19 ENCOUNTER — Other Ambulatory Visit: Payer: Self-pay

## 2022-07-19 DIAGNOSIS — Z9189 Other specified personal risk factors, not elsewhere classified: Secondary | ICD-10-CM

## 2022-07-26 ENCOUNTER — Other Ambulatory Visit: Payer: Self-pay | Admitting: Family Medicine

## 2022-07-26 DIAGNOSIS — Z9189 Other specified personal risk factors, not elsewhere classified: Secondary | ICD-10-CM

## 2022-08-29 ENCOUNTER — Other Ambulatory Visit: Payer: Medicare HMO

## 2022-12-05 ENCOUNTER — Ambulatory Visit: Payer: Medicare HMO

## 2022-12-05 DIAGNOSIS — Z1211 Encounter for screening for malignant neoplasm of colon: Secondary | ICD-10-CM | POA: Diagnosis present

## 2022-12-05 DIAGNOSIS — K5730 Diverticulosis of large intestine without perforation or abscess without bleeding: Secondary | ICD-10-CM | POA: Diagnosis not present

## 2022-12-05 DIAGNOSIS — D124 Benign neoplasm of descending colon: Secondary | ICD-10-CM | POA: Diagnosis not present

## 2022-12-05 DIAGNOSIS — K640 First degree hemorrhoids: Secondary | ICD-10-CM | POA: Diagnosis not present

## 2022-12-05 DIAGNOSIS — Z86010 Personal history of colonic polyps: Secondary | ICD-10-CM | POA: Diagnosis not present

## 2022-12-05 DIAGNOSIS — K6289 Other specified diseases of anus and rectum: Secondary | ICD-10-CM | POA: Diagnosis not present

## 2023-01-22 ENCOUNTER — Other Ambulatory Visit: Payer: Self-pay

## 2023-01-22 DIAGNOSIS — Z9189 Other specified personal risk factors, not elsewhere classified: Secondary | ICD-10-CM

## 2023-01-22 DIAGNOSIS — I10 Essential (primary) hypertension: Secondary | ICD-10-CM

## 2023-01-28 ENCOUNTER — Ambulatory Visit
Admission: RE | Admit: 2023-01-28 | Discharge: 2023-01-28 | Disposition: A | Discharge status: Home / Self Care | Payer: Medicare HMO | Source: Ambulatory Visit | Attending: Internal Medicine | Admitting: Internal Medicine

## 2023-01-28 DIAGNOSIS — Z9189 Other specified personal risk factors, not elsewhere classified: Secondary | ICD-10-CM

## 2023-01-28 DIAGNOSIS — I10 Essential (primary) hypertension: Secondary | ICD-10-CM

## 2024-09-08 ENCOUNTER — Telehealth: Payer: Self-pay

## 2024-09-08 NOTE — Telephone Encounter (Signed)
 Copied from CRM [id number]. Topic: Appointments - Scheduling Inquiry for Clinic >> Sep 04, 2024  3:34 PM Robinson H wrote: Reason for CRM: Patient states she was advised by Dr. Glendia to reach out to Darice or Luke at office to schedule a new patient appointment with her. Agent not seeing any notes or messages in system.  Eldon [phone]  Please verify ok to schedule as a new patient.

## 2024-09-08 NOTE — Telephone Encounter (Signed)
Ok to schedule establish care appt. Thanks

## 2024-12-08 ENCOUNTER — Ambulatory Visit: Admitting: Internal Medicine
